# Patient Record
Sex: Female | Born: 1960 | Race: Black or African American | Hispanic: No | Marital: Single | Smoking: Current every day smoker
Health system: Southern US, Community
[De-identification: ages and names within clinical notes are randomized; demographics above are authoritative.]

## PROBLEM LIST (undated history)

## (undated) DIAGNOSIS — I10 Essential (primary) hypertension: Secondary | ICD-10-CM

---

## 2011-09-09 ENCOUNTER — Ambulatory Visit: Payer: Self-pay

## 2017-11-30 ENCOUNTER — Emergency Department (HOSPITAL_COMMUNITY): Payer: Self-pay

## 2017-11-30 ENCOUNTER — Inpatient Hospital Stay (HOSPITAL_COMMUNITY)
Admission: EM | Admit: 2017-11-30 | Discharge: 2017-12-02 | DRG: 069 | Disposition: A | Discharge status: Home / Self Care | Payer: Self-pay | Attending: Family Medicine | Admitting: Family Medicine

## 2017-11-30 ENCOUNTER — Inpatient Hospital Stay (HOSPITAL_COMMUNITY): Payer: Self-pay

## 2017-11-30 ENCOUNTER — Encounter: Payer: Self-pay | Admitting: Pediatric Intensive Care

## 2017-11-30 ENCOUNTER — Encounter (HOSPITAL_COMMUNITY): Payer: Self-pay | Admitting: *Deleted

## 2017-11-30 DIAGNOSIS — I10 Essential (primary) hypertension: Secondary | ICD-10-CM | POA: Diagnosis present

## 2017-11-30 DIAGNOSIS — Z590 Homelessness: Secondary | ICD-10-CM

## 2017-11-30 DIAGNOSIS — G459 Transient cerebral ischemic attack, unspecified: Principal | ICD-10-CM | POA: Diagnosis present

## 2017-11-30 DIAGNOSIS — Z720 Tobacco use: Secondary | ICD-10-CM

## 2017-11-30 DIAGNOSIS — R0602 Shortness of breath: Secondary | ICD-10-CM

## 2017-11-30 DIAGNOSIS — E785 Hyperlipidemia, unspecified: Secondary | ICD-10-CM | POA: Diagnosis present

## 2017-11-30 DIAGNOSIS — Z79899 Other long term (current) drug therapy: Secondary | ICD-10-CM

## 2017-11-30 DIAGNOSIS — F17210 Nicotine dependence, cigarettes, uncomplicated: Secondary | ICD-10-CM | POA: Diagnosis present

## 2017-11-30 DIAGNOSIS — J90 Pleural effusion, not elsewhere classified: Secondary | ICD-10-CM | POA: Diagnosis present

## 2017-11-30 DIAGNOSIS — R4781 Slurred speech: Secondary | ICD-10-CM | POA: Diagnosis present

## 2017-11-30 DIAGNOSIS — Z7982 Long term (current) use of aspirin: Secondary | ICD-10-CM

## 2017-11-30 DIAGNOSIS — R29810 Facial weakness: Secondary | ICD-10-CM | POA: Diagnosis present

## 2017-11-30 HISTORY — DX: Essential (primary) hypertension: I10

## 2017-11-30 LAB — PROTIME-INR
INR: 1
PROTHROMBIN TIME: 13.1 s (ref 11.4–15.2)

## 2017-11-30 LAB — COMPREHENSIVE METABOLIC PANEL
ALK PHOS: 100 U/L (ref 38–126)
ALT: 14 U/L (ref 0–44)
ANION GAP: 7 (ref 5–15)
AST: 17 U/L (ref 15–41)
Albumin: 3.7 g/dL (ref 3.5–5.0)
BILIRUBIN TOTAL: 0.7 mg/dL (ref 0.3–1.2)
BUN: 14 mg/dL (ref 6–20)
CALCIUM: 9.6 mg/dL (ref 8.9–10.3)
CO2: 27 mmol/L (ref 22–32)
Chloride: 107 mmol/L (ref 98–111)
Creatinine, Ser: 0.63 mg/dL (ref 0.44–1.00)
GFR calc non Af Amer: >60 mL/min (ref >60)
Glucose, Bld: 101 mg/dL — ABNORMAL HIGH (ref 70–99)
Potassium: 3.9 mmol/L (ref 3.5–5.1)
SODIUM: 141 mmol/L (ref 135–145)
TOTAL PROTEIN: 6.5 g/dL (ref 6.5–8.1)

## 2017-11-30 LAB — DIFFERENTIAL
Abs Immature Granulocytes: 0.01 10*3/uL (ref 0.00–0.07)
BASOS ABS: 0.1 10*3/uL (ref 0.0–0.1)
Basophils Relative: 1 %
EOS PCT: 3 %
Eosinophils Absolute: 0.2 10*3/uL (ref 0.0–0.5)
Immature Granulocytes: 0 %
LYMPHS PCT: 53 %
Lymphs Abs: 3.4 10*3/uL (ref 0.7–4.0)
MONO ABS: 0.8 10*3/uL (ref 0.1–1.0)
Monocytes Relative: 12 %
Neutro Abs: 2 10*3/uL (ref 1.7–7.7)
Neutrophils Relative %: 31 %

## 2017-11-30 LAB — I-STAT CHEM 8, ED
BUN: 15 mg/dL (ref 6–20)
Calcium, Ion: 1.27 mmol/L (ref 1.15–1.40)
Chloride: 105 mmol/L (ref 98–111)
Creatinine, Ser: 0.7 mg/dL (ref 0.44–1.00)
GLUCOSE: 96 mg/dL (ref 70–99)
HCT: 40 % (ref 36.0–46.0)
HEMOGLOBIN: 13.6 g/dL (ref 12.0–15.0)
Potassium: 3.9 mmol/L (ref 3.5–5.1)
SODIUM: 141 mmol/L (ref 135–145)
TCO2: 28 mmol/L (ref 22–32)

## 2017-11-30 LAB — CBC
HEMATOCRIT: 40.4 % (ref 36.0–46.0)
Hemoglobin: 12.6 g/dL (ref 12.0–15.0)
MCH: 30 pg (ref 26.0–34.0)
MCHC: 31.2 g/dL (ref 30.0–36.0)
MCV: 96.2 fL (ref 80.0–100.0)
Platelets: 252 10*3/uL (ref 150–400)
RBC: 4.2 MIL/uL (ref 3.87–5.11)
RDW: 15.1 % (ref 11.5–15.5)
WBC: 6.5 10*3/uL (ref 4.0–10.5)
nRBC: 0 % (ref 0.0–0.2)

## 2017-11-30 LAB — I-STAT TROPONIN, ED: TROPONIN I, POC: 0.01 ng/mL (ref 0.00–0.08)

## 2017-11-30 LAB — APTT: APTT: 30 s (ref 24–36)

## 2017-11-30 MED ORDER — ACETAMINOPHEN 650 MG RE SUPP: 650.0000 mg | RECTAL | Status: DC | PRN

## 2017-11-30 MED ORDER — SENNOSIDES-DOCUSATE SODIUM 8.6-50 MG PO TABS: 1.0000 | ORAL_TABLET | Freq: Every evening | ORAL | Status: DC | PRN

## 2017-11-30 MED ORDER — ACETAMINOPHEN 325 MG PO TABS: 650.0000 mg | ORAL_TABLET | ORAL | Status: DC | PRN

## 2017-11-30 MED ORDER — ENOXAPARIN SODIUM 40 MG/0.4ML ~~LOC~~ SOLN
40.0000 mg | Freq: Every day | SUBCUTANEOUS | Status: DC
  Administered 2017-12-01 – 2017-12-02 (×2): 40 mg via SUBCUTANEOUS
  Filled 2017-11-30 (×2): qty 0.4

## 2017-11-30 MED ORDER — LISINOPRIL 10 MG PO TABS
10.0000 mg | ORAL_TABLET | Freq: Every day | ORAL | Status: DC
  Administered 2017-11-30 – 2017-12-01 (×2): 10 mg via ORAL
  Filled 2017-11-30 (×2): qty 1

## 2017-11-30 MED ORDER — STROKE: EARLY STAGES OF RECOVERY BOOK
Freq: Once | Status: AC
  Administered 2017-11-30: 23:00:00

## 2017-11-30 MED ORDER — AMLODIPINE BESYLATE 5 MG PO TABS
5.0000 mg | ORAL_TABLET | Freq: Every day | ORAL | Status: DC
  Administered 2017-11-30 – 2017-12-01 (×2): 5 mg via ORAL
  Filled 2017-11-30 (×2): qty 1

## 2017-11-30 MED ORDER — ASPIRIN EC 81 MG PO TBEC
81.0000 mg | DELAYED_RELEASE_TABLET | Freq: Every day | ORAL | Status: DC
  Administered 2017-11-30 – 2017-12-01 (×2): 81 mg via ORAL
  Filled 2017-11-30 (×2): qty 1

## 2017-11-30 MED ORDER — ACETAMINOPHEN 160 MG/5ML PO SOLN: 650.0000 mg | ORAL | Status: DC | PRN

## 2017-11-30 NOTE — ED Notes (Signed)
Attempted report however was advised to called back in 10 minutes.
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
## 2017-11-30 NOTE — ED Provider Notes (Signed)
[facility] EMERGENCY DEPARTMENT Provider Note   CSN: [id number] Arrival date & time: 11/30/17  1146     History   Chief Complaint Chief Complaint  Patient presents with  . Weakness    HPI Sherri Valencia is a 57 y.o. female.  Patient with history of hypertension, question remote history of high cholesterol --presents the emergency department today with complaint of approximately a week of intermittent facial droop, slurred speech, and numbness in her left arm and leg.  Symptoms began acutely and then resolve after short time.  Last episode of this was this morning.  Patient's husband brought her to a nurse today where she was found to have elevated blood pressure.  She is currently taking lisinopril and amlodipine and has 2 bottles of each and states that she has been taking 1 pill from each bottle every day.  It sounds like she may be doubling her dose by accident.  She is also on 81 mg of aspirin but states that she has been out for the past several days.  She states that her symptoms are currently resolved and husband agrees.  She denies any weakness in her arms or legs, difficulty with walking or with balance.  She denies any recent headaches.  She currently resides in a shelter. No h/o afib reported. Aggravating factors: none. Alleviating factors: none.       Past Medical History:  Diagnosis Date  . Hypertension     There are no active problems to display for this patient.   History reviewed. No pertinent surgical history.   OB History   None      Home Medications    Prior to Admission medications   Not on File    Family History History reviewed. No pertinent family history.  Social History Social History   Tobacco Use  . Smoking status: Current Every Day Smoker  . Smokeless tobacco: Never Used  Substance Use Topics  . Alcohol use: Not on file  . Drug use: Not on file     Allergies   Patient has no known allergies.   Review  of Systems Review of Systems  Constitutional: Negative for fever.  HENT: Negative for rhinorrhea and sore throat.   Eyes: Negative for redness.  Respiratory: Negative for cough.   Cardiovascular: Negative for chest pain.  Gastrointestinal: Negative for abdominal pain, diarrhea, nausea and vomiting.  Genitourinary: Negative for dysuria.  Musculoskeletal: Negative for myalgias.  Skin: Negative for rash.  Neurological: Positive for facial asymmetry, weakness (facial, left) and numbness. Negative for tremors, speech difficulty and headaches.     Physical Exam Updated Vital Signs BP (!) 167/88   Pulse 60   Temp 98 F (36.7 C) (Oral)   Resp (!) 24   SpO2 100%   Physical Exam  Constitutional: She is oriented to person, place, and time. She appears well-developed and well-nourished.  HENT:  Head: Normocephalic and atraumatic.  Right Ear: Tympanic membrane, external ear and ear canal normal.  Left Ear: Tympanic membrane, external ear and ear canal normal.  Nose: Nose normal.  Mouth/Throat: Uvula is midline, oropharynx is clear and moist and mucous membranes are normal.  Eyes: Pupils are equal, round, and reactive to light. Conjunctivae, EOM and lids are normal. Right eye exhibits no nystagmus. Left eye exhibits no nystagmus.  Neck: Normal range of motion. Neck supple.  No carotid bruit heard.   Cardiovascular: Normal rate and regular rhythm.  Pulmonary/Chest: Effort normal and breath sounds normal.  Abdominal: Soft. There is no tenderness.  Musculoskeletal: She exhibits no edema or tenderness.       Cervical back: She exhibits normal range of motion, no tenderness and no bony tenderness.  Neurological: She is alert and oriented to person, place, and time. She has normal strength and normal reflexes. No cranial nerve deficit or sensory deficit. She displays a negative Romberg sign. Coordination and gait normal. GCS eye subscore is 4. GCS verbal subscore is 5. GCS motor subscore is 6.    Skin: Skin is warm and dry.  Psychiatric: She has a normal mood and affect.  Nursing note and vitals reviewed.    ED Treatments / Results  Labs (all labs ordered are listed, but only abnormal results are displayed) Labs Reviewed  COMPREHENSIVE METABOLIC PANEL - Abnormal; Notable for the following components:      Result Value   Glucose, Bld 101 (*)    All other components within normal limits  PROTIME-INR  APTT  CBC  DIFFERENTIAL  I-STAT TROPONIN, ED  CBG MONITORING, ED  I-STAT CHEM 8, ED    ED ECG REPORT   Date: 11/30/2017  Rate: 81  Rhythm: normal sinus rhythm  QRS Axis: normal  Intervals: normal  ST/T Wave abnormalities: nonspecific T wave changes  Conduction Disutrbances:none  Narrative Interpretation: LVH, ?u-waves  Old EKG Reviewed: none available  I have personally reviewed the EKG tracing and agree with the computerized printout as noted.  Radiology Ct Head Wo Contrast  Result Date: 11/30/2017 CLINICAL DATA:  Left-sided facial droop both upper and lower extremity left-sided weakness. Hypertension. EXAM: CT HEAD WITHOUT CONTRAST TECHNIQUE: Contiguous axial images were obtained from the base of the skull through the vertex without intravenous contrast. COMPARISON:  None. FINDINGS: Brain: The ventricles are normal in size and configuration. There is no demonstrable intracranial mass, hemorrhage, extra-axial fluid collection, or midline shift. There is slight small vessel disease in the centra semiovale bilaterally. No acute infarct is evident. Vascular: There is no evident hyperdense vessel. There is mild calcification in each cavernous carotid artery region. Skull: The bony calvarium appears intact. Sinuses/Orbits: Visualized paranasal sinuses are clear. Orbits appear symmetric bilaterally. Other: Mastoid air cells are clear. IMPRESSION: Slight periventricular small vessel disease. No acute infarct evident. No mass or hemorrhage. There are foci of vascular  calcification in each cavernous carotid artery. Electronically Signed   By: Bretta Bang III M.D.   On: 11/30/2017 14:40    Procedures Procedures (including critical care time)  Medications Ordered in ED Medications - No data to display   Initial Impression / Assessment and Plan / ED Course  I have reviewed the triage vital signs and the nursing notes.  Pertinent labs & imaging results that were available during my care of the patient were reviewed by me and considered in my medical decision making (see chart for details).     Patient seen and examined. Work-up reviewed.   Vital signs reviewed and are as follows: BP (!) 175/93   Pulse 63   Temp 98 F (36.7 C) (Oral)   Resp (!) 21   SpO2 100%   Concern for TIA. No code stroke due to resolved symptoms. Patient does not have good PCP f/u currently and would benefit from admission.   Discussed with Dr. Effie Shy. Will request admit.   Spoke with [facility]. They will see patient.   Final Clinical Impressions(s) / ED Diagnoses   Final diagnoses:  Essential hypertension  Facial droop  TIA (transient ischemic attack)  Admit/obs for TIA sx in setting of poorly controlled hypertension.  ED Discharge Orders    None       Renne Crigler, Cordelia Poche 11/30/17 1636    Mancel Bale, MD 12/02/17 1012    Mancel Bale, MD 12/02/17 1013

## 2017-11-30 NOTE — Congregational Nurse Program (Signed)
New client encounter- client is here with husband, states they recently arrived from [facility]. Client states history of hypertension. States she was seen in clinic at shelter in [place] and prescribed amlodipine 5mg  + lisinopril 10mg  daily. Client states she takes these medications at night due to causing "dizziness". Client is concerned about LUE weakness which she states is new onset- last week- along with facial droop. Client partner states he noticed face droop (right side) about 3 days ago and client began slurring her speech. Client does not have PCP in area. CN provided bus passes and advised client to seek further evaluation in [facility] ED. Client agreed to plan and to seek further evaluation.

## 2017-11-30 NOTE — ED Triage Notes (Signed)
Pt in c/o hypertension this morning, in the last week she had left facial droop and left arm weakness and leg numbness at times, went [facility] and was told her BP was high, no distress noted

## 2017-11-30 NOTE — ED Notes (Signed)
Pt returned from MRI °

## 2017-11-30 NOTE — Progress Notes (Signed)
Pt admitted from ED with stroke diagnosis, pt alert and oriented, c/o of slight lower abdominal pain, settled in bed with husband and call light at bedside, tele monitor put and verified on pt, safety concern addressed accordingly, was however reassured and will continue to monitor, v/s stable. Obasogie-Asidi, Chelle Cayton Efe
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
## 2017-11-30 NOTE — ED Notes (Signed)
Patient transported to MRI 

## 2017-12-01 ENCOUNTER — Other Ambulatory Visit: Payer: Self-pay

## 2017-12-01 ENCOUNTER — Inpatient Hospital Stay (HOSPITAL_COMMUNITY): Payer: Self-pay

## 2017-12-01 ENCOUNTER — Ambulatory Visit (HOSPITAL_COMMUNITY): Payer: Self-pay

## 2017-12-01 DIAGNOSIS — I10 Essential (primary) hypertension: Secondary | ICD-10-CM

## 2017-12-01 DIAGNOSIS — G459 Transient cerebral ischemic attack, unspecified: Principal | ICD-10-CM

## 2017-12-01 DIAGNOSIS — I5030 Unspecified diastolic (congestive) heart failure: Secondary | ICD-10-CM

## 2017-12-01 DIAGNOSIS — R0602 Shortness of breath: Secondary | ICD-10-CM

## 2017-12-01 DIAGNOSIS — Z720 Tobacco use: Secondary | ICD-10-CM

## 2017-12-01 LAB — BASIC METABOLIC PANEL
Anion gap: 7 (ref 5–15)
BUN: 10 mg/dL (ref 6–20)
CO2: 24 mmol/L (ref 22–32)
CREATININE: 0.61 mg/dL (ref 0.44–1.00)
Calcium: 9.2 mg/dL (ref 8.9–10.3)
Chloride: 108 mmol/L (ref 98–111)
GFR calc Af Amer: >60 mL/min (ref >60)
GFR calc non Af Amer: >60 mL/min (ref >60)
Glucose, Bld: 102 mg/dL — ABNORMAL HIGH (ref 70–99)
Potassium: 3.5 mmol/L (ref 3.5–5.1)
SODIUM: 139 mmol/L (ref 135–145)

## 2017-12-01 LAB — RAPID URINE DRUG SCREEN, HOSP PERFORMED
AMPHETAMINES: NOT DETECTED
BENZODIAZEPINES: NOT DETECTED
Barbiturates: NOT DETECTED
Cocaine: NOT DETECTED
OPIATES: NOT DETECTED
TETRAHYDROCANNABINOL: NOT DETECTED

## 2017-12-01 LAB — HEMOGLOBIN A1C
Hgb A1c MFr Bld: 4.7 % — ABNORMAL LOW (ref 4.8–5.6)
Mean Plasma Glucose: 88.19 mg/dL

## 2017-12-01 LAB — ECHOCARDIOGRAM COMPLETE
Height: 66 in
Weight: 2761.92 oz

## 2017-12-01 LAB — CBC
HCT: 38 % (ref 36.0–46.0)
Hemoglobin: 12.3 g/dL (ref 12.0–15.0)
MCH: 30.4 pg (ref 26.0–34.0)
MCHC: 32.4 g/dL (ref 30.0–36.0)
MCV: 93.8 fL (ref 80.0–100.0)
Platelets: 227 10*3/uL (ref 150–400)
RBC: 4.05 MIL/uL (ref 3.87–5.11)
RDW: 14.9 % (ref 11.5–15.5)
WBC: 5.9 10*3/uL (ref 4.0–10.5)
nRBC: 0 % (ref 0.0–0.2)

## 2017-12-01 LAB — TSH: TSH: 2.754 u[IU]/mL (ref 0.350–4.500)

## 2017-12-01 LAB — HIV ANTIBODY (ROUTINE TESTING W REFLEX): HIV SCREEN 4TH GENERATION: NONREACTIVE

## 2017-12-01 LAB — LIPID PANEL
CHOLESTEROL: 185 mg/dL (ref 0–200)
HDL: 71 mg/dL (ref >40)
LDL Cholesterol: 99 mg/dL (ref 0–99)
TRIGLYCERIDES: 74 mg/dL (ref <150)
Total CHOL/HDL Ratio: 2.6 RATIO
VLDL: 15 mg/dL (ref 0–40)

## 2017-12-01 MED ORDER — NICOTINE 7 MG/24HR TD PT24: 7.0000 mg | MEDICATED_PATCH | Freq: Every day | TRANSDERMAL | Status: DC

## 2017-12-01 MED ORDER — NICOTINE 21 MG/24HR TD PT24
21.0000 mg | MEDICATED_PATCH | Freq: Every day | TRANSDERMAL | Status: DC
  Administered 2017-12-01 – 2017-12-02 (×2): 21 mg via TRANSDERMAL
  Filled 2017-12-01 (×2): qty 1

## 2017-12-01 MED ORDER — HYDROCHLOROTHIAZIDE 12.5 MG PO CAPS
12.5000 mg | ORAL_CAPSULE | Freq: Every day | ORAL | Status: DC
Start: 2017-12-01 — End: 2017-12-02
  Administered 2017-12-01 – 2017-12-02 (×2): 12.5 mg via ORAL
  Filled 2017-12-01 (×2): qty 1

## 2017-12-01 MED ORDER — CLOPIDOGREL BISULFATE 75 MG PO TABS
75.0000 mg | ORAL_TABLET | Freq: Every day | ORAL | Status: DC
  Administered 2017-12-01 – 2017-12-02 (×2): 75 mg via ORAL
  Filled 2017-12-01 (×2): qty 1

## 2017-12-01 MED ORDER — ATORVASTATIN CALCIUM 40 MG PO TABS
40.0000 mg | ORAL_TABLET | Freq: Every day | ORAL | Status: DC
  Administered 2017-12-01: 40 mg via ORAL
  Filled 2017-12-01: qty 1
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
## 2017-12-01 NOTE — Progress Notes (Signed)
  Echocardiogram 2D Echocardiogram has been performed.  Pieter Partridge 12/01/2017, 12:12 PM

## 2017-12-01 NOTE — Progress Notes (Signed)
*  Preliminary Results* Carotid artery duplex has been completed. Bilateral internal carotid arteries are near-normal with 1-39%. Vertebral arteries are patent with antegrade flow.  12/01/2017 12:03 PM  Sherri Valencia Clare Gandy

## 2017-12-01 NOTE — Evaluation (Signed)
Speech Language Pathology Evaluation Patient Details Name: Sherri Valencia MRN: [id number] DOB: 04-09-1960 Today's Date: 12/01/2017 Time: 1040-1055 SLP Time Calculation (min) (ACUTE ONLY): 15 min  Problem List:  Patient Active Problem List   Diagnosis Date Noted  . TIA (transient ischemic attack) 11/30/2017   Past Medical History:  Past Medical History:  Diagnosis Date  . Hypertension    Past Surgical History: History reviewed. No pertinent surgical history. HPI:  Pt is a 57 y.o. female admitted to hospital after 1 week history of intermittent left upper extremity, left lower extremity weakness and numbness. CT revealed subacute small left internal capsule infarct and mild-to-moderate chronic small vessel ischemic changes. PMH signficant for HTN.   Assessment / Plan / Recommendation Clinical Impression   Pt presents with a mild dysarthria resulting from left sided oral motor weakness which impacts articulatory precision at the conversational level.  Pt was fully intelligible in conversations.  Pt reported mild focal right sided "tightness" (CN V) but sensory exam was otherwise unremarkable.  Pt appears to have grossly [facility] cognitive-linguistic function for all tasks assessed and pt appears appropriate to return to PLOF.  Pt denies any changes in cognition or language and reports that her speech is improving daily.   SLP did provide skilled education regarding compensatory strategies to achieve articulatory precision, emphasizing overarticulation, slow rate, and loud voice. All questions were answered to pt's satisfaction at this time.   No further ST needs are indicated at this time.      SLP Assessment  SLP Recommendation/Assessment: Patient does not need any further Speech Lanaguage Pathology Services SLP Visit Diagnosis: Dysarthria and anarthria (R47.1)    Follow Up Recommendations  None          SLP Evaluation Cognition  Overall Cognitive Status: Within Functional Limits for  tasks assessed Attention: Sustained Sustained Attention: Appears intact Memory: Appears intact(3/4 delayed recall) Awareness: Appears intact Safety/Judgment: Appears intact       Comprehension  Auditory Comprehension Overall Auditory Comprehension: Appears within functional limits for tasks assessed    Expression Expression Primary Mode of Expression: Verbal Verbal Expression Overall Verbal Expression: Appears within functional limits for tasks assessed Written Expression Dominant Hand: Right   Oral / Motor  Oral Motor/Sensory Function Overall Oral Motor/Sensory Function: Mild impairment Facial ROM: Reduced left Facial Symmetry: Abnormal symmetry left Facial Sensation: (pt reports "tightness" on right side, otherwise unremarkable) Lingual ROM: Within Functional Limits Lingual Symmetry: Abnormal symmetry left Lingual Strength: Within Functional Limits Mandible: Within Functional Limits Motor Speech Overall Motor Speech: Appears within functional limits for tasks assessed Respiration: Within functional limits Phonation: Normal Resonance: Within functional limits Articulation: Impaired Level of Impairment: Conversation Intelligibility: Intelligible Motor Planning: Witnin functional limits   GO                    Lynden Carrithers, Melanee Spry 12/01/2017, 11:04 AM
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
## 2017-12-02 ENCOUNTER — Telehealth: Payer: Self-pay

## 2017-12-02 LAB — CBC
HEMATOCRIT: 39.2 % (ref 36.0–46.0)
HEMOGLOBIN: 12.4 g/dL (ref 12.0–15.0)
MCH: 30.1 pg (ref 26.0–34.0)
MCHC: 31.6 g/dL (ref 30.0–36.0)
MCV: 95.1 fL (ref 80.0–100.0)
NRBC: 0 % (ref 0.0–0.2)
Platelets: 248 10*3/uL (ref 150–400)
RBC: 4.12 MIL/uL (ref 3.87–5.11)
RDW: 14.7 % (ref 11.5–15.5)
WBC: 4.9 10*3/uL (ref 4.0–10.5)

## 2017-12-02 LAB — BASIC METABOLIC PANEL
Anion gap: 4 — ABNORMAL LOW (ref 5–15)
BUN: 12 mg/dL (ref 6–20)
CO2: 29 mmol/L (ref 22–32)
Calcium: 9.5 mg/dL (ref 8.9–10.3)
Chloride: 106 mmol/L (ref 98–111)
Creatinine, Ser: 0.66 mg/dL (ref 0.44–1.00)
GFR calc Af Amer: >60 mL/min (ref >60)
GFR calc non Af Amer: >60 mL/min (ref >60)
Glucose, Bld: 94 mg/dL (ref 70–99)
Potassium: 3.5 mmol/L (ref 3.5–5.1)
Sodium: 139 mmol/L (ref 135–145)

## 2017-12-02 MED ORDER — ATORVASTATIN CALCIUM 40 MG PO TABS: 40.0000 mg | ORAL_TABLET | Freq: Every day | ORAL | 0 refills | Status: AC

## 2017-12-02 MED ORDER — NICOTINE 21 MG/24HR TD PT24: 21.0000 mg | MEDICATED_PATCH | Freq: Every day | TRANSDERMAL | 0 refills | Status: DC

## 2017-12-02 MED ORDER — AMLODIPINE BESYLATE 5 MG PO TABS: 5.0000 mg | ORAL_TABLET | Freq: Every day | ORAL | 0 refills | Status: AC

## 2017-12-02 MED ORDER — ASPIRIN EC 81 MG PO TBEC: 81.0000 mg | DELAYED_RELEASE_TABLET | Freq: Every day | ORAL | 0 refills | Status: DC

## 2017-12-02 MED ORDER — CLOPIDOGREL BISULFATE 75 MG PO TABS: 75.0000 mg | ORAL_TABLET | Freq: Every day | ORAL | 0 refills | Status: AC

## 2017-12-02 MED ORDER — HYDROCHLOROTHIAZIDE 12.5 MG PO CAPS: 12.5000 mg | ORAL_CAPSULE | Freq: Every day | ORAL | 0 refills | Status: AC

## 2017-12-02 MED ORDER — LISINOPRIL 10 MG PO TABS: 10.0000 mg | ORAL_TABLET | Freq: Every day | ORAL | 0 refills | Status: AC

## 2017-12-02 MED ORDER — SENNOSIDES-DOCUSATE SODIUM 8.6-50 MG PO TABS: 1.0000 | ORAL_TABLET | Freq: Every evening | ORAL | 0 refills | Status: DC | PRN

## 2017-12-02 MED FILL — CLOPIDOGREL 75 MG TABLET: 75 | 30 days supply | Qty: 30 | Fill #0 | Status: TO

## 2017-12-02 MED FILL — ATORVASTATIN CALCIUM 40 MG: 40 | 30 days supply | Qty: 30 | Fill #0 | Status: TO

## 2017-12-02 MED FILL — LISINOPRIL 10 MG TABS: 10 | 30 days supply | Qty: 30 | Fill #0 | Status: TO

## 2017-12-02 MED FILL — ASPIRIN LOW DOSE 81 MG TBEC: 81 | 21 days supply | Qty: 21 | Fill #0

## 2017-12-02 MED FILL — SENNA S 8.6-50 MG TABS: 8.6-50 | 30 days supply | Qty: 30 | Fill #0

## 2017-12-02 MED FILL — HYDROCHLOROTHIAZIDE 12.5 MG: 12.5 | 30 days supply | Qty: 30 | Fill #0 | Status: TO

## 2017-12-02 MED FILL — AMLODIPINE BESYLATE 5 MG TA: 5 | 30 days supply | Qty: 30 | Fill #0 | Status: TO
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
## 2017-12-02 NOTE — Telephone Encounter (Signed)
Message received from Fabio Neighbors, RN CM requesting a hospital follow up appointment for the patient  An appointment was scheduled for 12/11/17 @ 1430 and the information was placed on the AVS. 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

## 2017-12-02 NOTE — Discharge Instructions (Signed)
Call 1800-QUIT-NOW for help with stopping smoking. They can assist with free resources such as patches, check-in calls, and counseling.   You will continue to use your current nicotine patch for 6 weeks.  Then he will need to be seen by her doctor to get a decreased dose.  Please make sure that you do not smoke with a nicotine patch on.  He will continue to take aspirin and Plavix together for 3 weeks.  At the end of that 3 weeks you should stop taking aspirin, but continue to take Plavix daily.  Please make sure that you follow-up with your new doctor on 11/8, as scheduled.  I have given you enough medications for 90 days.

## 2017-12-02 NOTE — Progress Notes (Signed)
Occupational Therapy Treatment Patient Details Name: Sherri Valencia MRN: [id number] DOB: 04-26-1960 Today's Date: 12/02/2017    History of present illness PT is a 57 y.o. female admitted to hospital after 1 week history of intermittent left upper extremity, left lower extremity weakness and numbness. CT revealed subacute small left internal capsule infarct and mild-to-moderate chronic small vessel ischemic changes. PMH signficant for HTN.   OT comments  Patient progressing well.  Demonstrates safe shower transfer and bathing techniques standing using grabbars. Reviewed med mgmt safety and use of pill box, medi-cog completed scoring 8/10 with errors with "multiple pills per day" but verbalized understanding after education.  Visual scanning improving, possible visual attention deficit and reviewed use of cross word puzzles at home.  Pt eager and thankful for recommendations.    Follow Up Recommendations  No OT follow up    Equipment Recommendations  None recommended by OT    Recommendations for Other Services      Precautions / Restrictions Precautions Precautions: Fall       Mobility Bed Mobility Overal bed mobility: Independent                Transfers Overall transfer level: Modified independent Equipment used: None Transfers: Sit to/from Stand Sit to Stand: Modified independent (Device/Increase time)         General transfer comment: no assist required    Balance Overall balance assessment: Independent                                         ADL either performed or assessed with clinical judgement   ADL Overall ADL's : Needs assistance/impaired             Lower Body Bathing: Modified independent Lower Body Bathing Details (indicate cue type and reason): demonstrated use of grabbars standing          Toilet Transfer: Modified Independent;Ambulation       Tub/ Shower Transfer: Modified independent;Ambulation   Functional  mobility during ADLs: Modified independent       Vision   Vision Assessment?: Yes Tracking/Visual Pursuits: Able to track stimulus in all quads without difficulty Additional Comments: improved visual scanning and holding outside midline, visual attention? reviewed visual attention scanning activitis pt can engage in at home (cross word puzzles)    Perception     Praxis      Cognition Arousal/Alertness: Awake/alert Behavior During Therapy: WFL for tasks assessed/performed Overall Cognitive Status: Impaired/Different from baseline Area of Impairment: Problem solving                             Problem Solving: Difficulty sequencing General Comments: Completed medi-cog with patient scoring 8/10, difficulty with problem sovling through multiple pills/day.         Exercises     Shoulder Instructions       General Comments spouse present    Pertinent Vitals/ Pain       Pain Assessment: No/denies pain  Home Living                                          Prior Functioning/Environment              Frequency  Min 2X/week  Progress Toward Goals  OT Goals(current goals can now be found in the care plan section)  Progress towards OT goals: Progressing toward goals  Acute Rehab OT Goals Patient Stated Goal: to get back home OT Goal Formulation: With patient Time For Goal Achievement: 12/08/17 Potential to Achieve Goals: Good  Plan Discharge plan remains appropriate;Frequency remains appropriate    Co-evaluation                 AM-PAC PT "6 Clicks" Daily Activity     Outcome Measure   Help from another person eating meals?: None Help from another person taking care of personal grooming?: None Help from another person toileting, which includes using toliet, bedpan, or urinal?: None Help from another person bathing (including washing, rinsing, drying)?: None Help from another person to put on and taking off regular  upper body clothing?: None Help from another person to put on and taking off regular lower body clothing?: None 6 Click Score: 24    End of Session    OT Visit Diagnosis: Other symptoms and signs involving the nervous system (R29.898);Other symptoms and signs involving cognitive function   Activity Tolerance Patient tolerated treatment well   Patient Left with family/visitor present;with call bell/phone within reach;in bed   Nurse Communication Mobility status        Time: 1610-9604 OT Time Calculation (min): 16 min  Charges: OT General Charges $OT Visit: 1 Visit OT Treatments $Self Care/Home Management : 8-22 mins  Chancy Milroy, OT Acute Rehabilitation Services Pager [phone] Office [phone]    Chancy Milroy 12/02/2017, 1:20 PM

## 2017-12-02 NOTE — Progress Notes (Signed)
Pt being discharged from hospital per orders from MD. Pt educated on discharge instructions. Pt verbalized understanding of instructions. All questions and concerns were addressed. Pt's IV was removed prior to discharge. Pt exited hospital via ambulation accompanied by staff. 
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
## 2017-12-04 ENCOUNTER — Encounter: Payer: Self-pay | Admitting: Pediatric Intensive Care

## 2017-12-11 ENCOUNTER — Ambulatory Visit: Payer: Self-pay | Attending: Family Medicine | Admitting: Family Medicine

## 2017-12-11 VITALS — BP 144/74 | HR 71 | Temp 97.7°F

## 2017-12-11 DIAGNOSIS — Z1239 Encounter for other screening for malignant neoplasm of breast: Secondary | ICD-10-CM

## 2017-12-11 DIAGNOSIS — Z79899 Other long term (current) drug therapy: Secondary | ICD-10-CM

## 2017-12-11 DIAGNOSIS — Z8673 Personal history of transient ischemic attack (TIA), and cerebral infarction without residual deficits: Secondary | ICD-10-CM

## 2017-12-11 DIAGNOSIS — F17210 Nicotine dependence, cigarettes, uncomplicated: Secondary | ICD-10-CM | POA: Insufficient documentation

## 2017-12-11 DIAGNOSIS — J90 Pleural effusion, not elsewhere classified: Secondary | ICD-10-CM

## 2017-12-11 DIAGNOSIS — I10 Essential (primary) hypertension: Secondary | ICD-10-CM

## 2017-12-11 DIAGNOSIS — Z720 Tobacco use: Secondary | ICD-10-CM

## 2017-12-11 DIAGNOSIS — R0602 Shortness of breath: Secondary | ICD-10-CM

## 2017-12-11 MED ORDER — NICOTINE 14 MG/24HR TD PT24: 14.0000 mg | MEDICATED_PATCH | Freq: Every day | TRANSDERMAL | 0 refills | Status: DC

## 2017-12-11 MED ORDER — NICOTINE 7 MG/24HR TD PT24: 7.0000 mg | MEDICATED_PATCH | Freq: Every day | TRANSDERMAL | 0 refills | Status: DC
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
## 2017-12-11 NOTE — Progress Notes (Signed)
Patient has been feeling weak and having SOB.  Patient needs a letter stating that she can go into shelter during the day to sit down and rest.

## 2017-12-12 LAB — COMPREHENSIVE METABOLIC PANEL WITH GFR
ALT: 12 IU/L (ref 0–32)
AST: 14 IU/L (ref 0–40)
Albumin/Globulin Ratio: 2 (ref 1.2–2.2)
Albumin: 4.7 g/dL (ref 3.5–5.5)
Alkaline Phosphatase: 121 IU/L — ABNORMAL HIGH (ref 39–117)
BUN/Creatinine Ratio: 25 — ABNORMAL HIGH (ref 9–23)
BUN: 16 mg/dL (ref 6–24)
Bilirubin Total: 0.4 mg/dL (ref 0.0–1.2)
CO2: 27 mmol/L (ref 20–29)
Calcium: 10.3 mg/dL — ABNORMAL HIGH (ref 8.7–10.2)
Chloride: 99 mmol/L (ref 96–106)
Creatinine, Ser: 0.63 mg/dL (ref 0.57–1.00)
GFR calc Af Amer: 114 mL/min/1.73
GFR calc non Af Amer: 98 mL/min/1.73
Globulin, Total: 2.3 g/dL (ref 1.5–4.5)
Glucose: 83 mg/dL (ref 65–99)
Potassium: 3.9 mmol/L (ref 3.5–5.2)
Sodium: 141 mmol/L (ref 134–144)
Total Protein: 7 g/dL (ref 6.0–8.5)

## 2017-12-12 LAB — CBC WITH DIFFERENTIAL/PLATELET
Basophils Absolute: 0.1 x10E3/uL (ref 0.0–0.2)
Basos: 1 %
EOS (ABSOLUTE): 0.3 x10E3/uL (ref 0.0–0.4)
Eos: 4 %
Hematocrit: 40.2 % (ref 34.0–46.6)
Hemoglobin: 13.4 g/dL (ref 11.1–15.9)
Immature Grans (Abs): 0 x10E3/uL (ref 0.0–0.1)
Immature Granulocytes: 0 %
Lymphocytes Absolute: 4.1 x10E3/uL — ABNORMAL HIGH (ref 0.7–3.1)
Lymphs: 56 %
MCH: 30.9 pg (ref 26.6–33.0)
MCHC: 33.3 g/dL (ref 31.5–35.7)
MCV: 93 fL (ref 79–97)
Monocytes Absolute: 0.8 x10E3/uL (ref 0.1–0.9)
Monocytes: 10 %
Neutrophils Absolute: 2.2 x10E3/uL (ref 1.4–7.0)
Neutrophils: 29 %
Platelets: 314 x10E3/uL (ref 150–450)
RBC: 4.34 x10E6/uL (ref 3.77–5.28)
RDW: 13 % (ref 12.3–15.4)
WBC: 7.5 x10E3/uL (ref 3.4–10.8)

## 2017-12-15 ENCOUNTER — Encounter: Payer: Self-pay | Admitting: Pediatric Intensive Care

## 2017-12-18 ENCOUNTER — Telehealth: Payer: Self-pay | Admitting: *Deleted

## 2017-12-18 ENCOUNTER — Other Ambulatory Visit (HOSPITAL_COMMUNITY): Payer: Self-pay | Admitting: *Deleted

## 2017-12-18 DIAGNOSIS — N644 Mastodynia: Secondary | ICD-10-CM

## 2017-12-18 NOTE — Telephone Encounter (Signed)
Notes recorded by Guy FrancoBenjamin, Eisen Robenson, RN on 12/18/2017 at 3:41 PM EST Left message on voicemail to return call.   ------  Notes recorded by Cain SaupeFulp, Cammie, MD on 12/15/2017 at 10:31 PM EST Please notify patient that her complete metabolic panel is essentially normal but she does have a very small increase in a nonspecific enzyme called alkaline phosphatase at 121 with normal being 39-117 This enzyme is found not only in the liver but also in the bone and salivary glands. Will recheck at a future date as this is still very close to normal. Patient with a normal complete blood count

## 2017-12-22 ENCOUNTER — Telehealth: Payer: Self-pay

## 2017-12-22 ENCOUNTER — Ambulatory Visit (HOSPITAL_COMMUNITY)
Admission: RE | Admit: 2017-12-22 | Discharge: 2017-12-22 | Disposition: A | Discharge status: Home / Self Care | Payer: Self-pay | Source: Ambulatory Visit | Attending: Family Medicine | Admitting: Family Medicine

## 2017-12-22 ENCOUNTER — Encounter: Payer: Self-pay | Admitting: Pediatric Intensive Care

## 2017-12-22 DIAGNOSIS — K769 Liver disease, unspecified: Secondary | ICD-10-CM | POA: Insufficient documentation

## 2017-12-22 DIAGNOSIS — I700 Atherosclerosis of aorta: Secondary | ICD-10-CM | POA: Insufficient documentation

## 2017-12-22 DIAGNOSIS — J90 Pleural effusion, not elsewhere classified: Secondary | ICD-10-CM | POA: Insufficient documentation

## 2017-12-22 MED ORDER — IOHEXOL 300 MG/ML  SOLN
75.0000 mL | Freq: Once | INTRAMUSCULAR | Status: AC | PRN
  Administered 2017-12-22: 75 mL via INTRAVENOUS

## 2017-12-22 NOTE — Telephone Encounter (Signed)
Request received from Shann MedalVictoria Hussey, RN/[facility] for a follow up appointment for the patient [facility]  An appointment was scheduled for 01/04/18 @ 1330

## 2017-12-24 NOTE — Telephone Encounter (Signed)
Left message on voicemail to return call. Unable to reach.  

## 2017-12-24 NOTE — Congregational Nurse Program (Signed)
Hospital follow up with BP check. Client given 90 days of medication from hospital discharge. CN assisted with organizing medication in pill box. Lower extremity edema is improved now +1 feet and ankles. SCAT application given. Follow up in clinic as needed.

## 2018-01-04 ENCOUNTER — Ambulatory Visit: Payer: Self-pay | Attending: Critical Care Medicine | Admitting: Critical Care Medicine

## 2018-01-04 ENCOUNTER — Encounter: Payer: Self-pay | Admitting: Pediatric Intensive Care

## 2018-01-04 ENCOUNTER — Encounter: Payer: Self-pay | Admitting: Critical Care Medicine

## 2018-01-04 ENCOUNTER — Other Ambulatory Visit: Payer: Self-pay

## 2018-01-04 VITALS — BP 139/85 | HR 81 | Temp 98.4°F | Ht 64.0 in | Wt 178.4 lb

## 2018-01-04 DIAGNOSIS — H60392 Other infective otitis externa, left ear: Secondary | ICD-10-CM

## 2018-01-04 DIAGNOSIS — H6090 Unspecified otitis externa, unspecified ear: Secondary | ICD-10-CM | POA: Insufficient documentation

## 2018-01-04 DIAGNOSIS — Z7982 Long term (current) use of aspirin: Secondary | ICD-10-CM | POA: Insufficient documentation

## 2018-01-04 DIAGNOSIS — J0100 Acute maxillary sinusitis, unspecified: Secondary | ICD-10-CM | POA: Insufficient documentation

## 2018-01-04 DIAGNOSIS — H6092 Unspecified otitis externa, left ear: Secondary | ICD-10-CM | POA: Insufficient documentation

## 2018-01-04 DIAGNOSIS — H6122 Impacted cerumen, left ear: Secondary | ICD-10-CM

## 2018-01-04 DIAGNOSIS — R0602 Shortness of breath: Secondary | ICD-10-CM | POA: Insufficient documentation

## 2018-01-04 DIAGNOSIS — Z79899 Other long term (current) drug therapy: Secondary | ICD-10-CM | POA: Insufficient documentation

## 2018-01-04 MED ORDER — ASPIRIN EC 81 MG PO TBEC: 81.0000 mg | DELAYED_RELEASE_TABLET | Freq: Every day | ORAL | 6 refills | Status: DC

## 2018-01-04 MED ORDER — SENNOSIDES-DOCUSATE SODIUM 8.6-50 MG PO TABS: 1.0000 | ORAL_TABLET | Freq: Every evening | ORAL | 6 refills | Status: AC | PRN

## 2018-01-04 MED ORDER — NEOMYCIN-POLYMYXIN-HC 1 % OT SOLN: 3.0000 [drp] | Freq: Four times a day (QID) | OTIC | 0 refills | Status: AC

## 2018-01-04 MED ORDER — DOXYCYCLINE HYCLATE 100 MG PO TABS: 100.0000 mg | ORAL_TABLET | Freq: Two times a day (BID) | ORAL | 0 refills | Status: AC

## 2018-01-04 MED FILL — AMLODIPINE BESYLATE 5 MG TA: 5 | 30 days supply | Qty: 30 | Fill #0

## 2018-01-04 MED FILL — CLOPIDOGREL 75 MG TABLET: 75 | 30 days supply | Qty: 30 | Fill #0

## 2018-01-04 MED FILL — ATORVASTATIN CALCIUM 40 MG: 40 | 30 days supply | Qty: 30 | Fill #0

## 2018-01-04 MED FILL — HYDROCHLOROTHIAZIDE 12.5 MG: 12.5 | 30 days supply | Qty: 30 | Fill #0

## 2018-01-04 MED FILL — LISINOPRIL 10 MG TABS: 10 | 30 days supply | Qty: 30 | Fill #0

## 2018-01-04 NOTE — Assessment & Plan Note (Signed)
Evidence for acute nonrecurrent maxillary sinusitis on exam  Plan  Doxycycline 100 mg twice daily for 5 days

## 2018-01-04 NOTE — Assessment & Plan Note (Addendum)
Shortness of breath is on the basis of an acute URI syndrome Note recent CT scan of the chest did not show pleural effusion or intrapulmonary infiltrates  Review sinusitis assessment
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
## 2018-01-04 NOTE — Progress Notes (Signed)
Subjective:    Patient ID: Sherri Valencia, female    DOB: 10/28/1960, 57 y.o.   MRN: [id number]  57 y.o.F here for URI.  [facility] Followed here for HTN last seen 12/12/17. Pt had FLu shot on 11/18  URI   This is a new problem. The current episode started 1 to 4 weeks ago. The problem has been unchanged. There has been no fever. Associated symptoms include congestion, coughing, a plugged ear sensation, rhinorrhea, sinus pain, sneezing, a sore throat, swollen glands and wheezing. Pertinent negatives include no abdominal pain, chest pain, diarrhea, ear pain, headaches, joint pain, joint swelling, nausea, neck pain, rash or vomiting. Associated symptoms comments: Noting wheezing in chest and coughing all night.  Cough is productive lyme green Green out of the nose. She has tried acetaminophen for the symptoms. The treatment provided no relief.   Past Medical History:  Diagnosis Date  . Hypertension      Family History  Problem Relation Age of Onset  . Alzheimer's disease Father   . Cancer Father   . Heart disease Father      Social History   Socioeconomic History  . Marital status: Single    Spouse name: Not on file  . Number of children: Not on file  . Years of education: Not on file  . Highest education level: Not on file  Occupational History  . Not on file  Social Needs  . Financial resource strain: Not on file  . Food insecurity:    Worry: Not on file    Inability: Not on file  . Transportation needs:    Medical: Not on file    Non-medical: Not on file  Tobacco Use  . Smoking status: Current Every Day Smoker    Types: Cigarettes  . Smokeless tobacco: Never Used  . Tobacco comment: smokes 2 cigs./ day  Substance and Sexual Activity  . Alcohol use: Not Currently  . Drug use: Not Currently  . Sexual activity: Not on file  Lifestyle  . Physical activity:    Days per week: Not on file    Minutes per session: Not on file  . Stress: Not on  file  Relationships  . Social connections:    Talks on phone: Not on file    Gets together: Not on file    Attends religious service: Not on file    Active member of club or organization: Not on file    Attends meetings of clubs or organizations: Not on file    Relationship status: Not on file  . Intimate partner violence:    Fear of current or ex partner: Not on file    Emotionally abused: Not on file    Physically abused: Not on file    Forced sexual activity: Not on file  Other Topics Concern  . Not on file  Social History Narrative  . Not on file     Allergies  Allergen Reactions  . Banana Hives     Outpatient Medications Prior to Visit  Medication Sig Dispense Refill  . amLODipine (NORVASC) 5 MG tablet Take 1 tablet (5 mg total) by mouth at bedtime. 90 tablet 0  . aspirin EC 81 MG tablet Take 1 tablet (81 mg total) by mouth at bedtime. Take for 3 weeks then stop. 21 tablet 0  . atorvastatin (LIPITOR) 40 MG tablet Take 1 tablet (40 mg total) by mouth daily at 6 PM. 90 tablet 0  . clopidogrel (  PLAVIX) 75 MG tablet Take 1 tablet (75 mg total) by mouth daily. 90 tablet 0  . hydrochlorothiazide (MICROZIDE) 12.5 MG capsule Take 1 capsule (12.5 mg total) by mouth daily. 90 capsule 0  . lisinopril (PRINIVIL,ZESTRIL) 10 MG tablet Take 1 tablet (10 mg total) by mouth at bedtime. 90 tablet 0  . senna-docusate (SENOKOT-S) 8.6-50 MG tablet Take 1 tablet by mouth at bedtime as needed for moderate constipation. 30 tablet 0  . nicotine (NICODERM CQ) 14 mg/24hr patch Place 1 patch (14 mg total) onto the skin daily. For two weeks then start 7 mg patch 14 patch 0  . nicotine (NICODERM CQ) 7 mg/24hr patch Place 1 patch (7 mg total) onto the skin daily. For two weeks after using the 14 mg patch (Patient not taking: Reported on 01/04/2018) 14 patch 0   No facility-administered medications prior to visit.       Review of Systems  HENT: Positive for congestion, rhinorrhea, sinus pain, sneezing  and sore throat. Negative for ear pain.   Respiratory: Positive for cough and wheezing.   Cardiovascular: Negative for chest pain.  Gastrointestinal: Negative for abdominal pain, diarrhea, nausea and vomiting.  Musculoskeletal: Negative for joint pain and neck pain.  Skin: Negative for rash.  Neurological: Negative for headaches.       Objective:   Physical Exam Vitals:   01/04/18 1337  BP: 139/85  Pulse: 81  Temp: 98.4 F (36.9 C)  TempSrc: Oral  SpO2: 98%  Weight: 178 lb 6.4 oz (80.9 kg)  Height: 5\' 4"  (1.626 m)    Gen: Pleasant, well-nourished, in no distress,  normal affect  ENT: purulence in nares,  mouth clear,  oropharynx clear, ++ postnasal drip,  Cerumen impaction L ear with external otitis inflammatory changes  Neck: No JVD, no TMG, no carotid bruits  Lungs: No use of accessory muscles, no dullness to percussion, clear without rales or rhonchi  Cardiovascular: RRR, heart sounds normal, no murmur or gallops, no peripheral edema  Abdomen: soft and NT, no HSM,  BS normal  Musculoskeletal: No deformities, no cyanosis or clubbing  Neuro: alert, non focal  Skin: Warm, no lesions or rashes  No results found.        Assessment & Plan:  I personally reviewed all images and lab data in the [facility] system as well as any outside material available during this office visit and agree with the  radiology impressions.   Acute non-recurrent maxillary sinusitis Evidence for acute nonrecurrent maxillary sinusitis on exam  Plan  Doxycycline 100 mg twice daily for 5 days  Otitis externa Left external otitis media with cerumen impaction  Plan  Nursing to remove cerumen Polymyxin otic solution 3 drops left ear 4 times daily for 1   17 mL bottle  Shortness of breath Shortness of breath is on the basis of an acute URI syndrome Note recent CT scan of the chest did not show pleural effusion or intrapulmonary infiltrates  Review sinusitis assessment     Sherri Valencia  was seen today for uri.  Diagnoses and all orders for this visit:  Other infective acute otitis externa of left ear  Acute non-recurrent maxillary sinusitis  Shortness of breath  Other orders -     aspirin EC 81 MG tablet; Take 1 tablet (81 mg total) by mouth at bedtime. Take for 3 weeks then stop. -     senna-docusate (SENOKOT-S) 8.6-50 MG tablet; Take 1 tablet by mouth at bedtime as needed for moderate constipation. -  doxycycline (VIBRA-TABS) 100 MG tablet; Take 1 tablet (100 mg total) by mouth 2 (two) times daily for 5 days. -     NEOMYCIN-POLYMYXIN-HYDROCORTISONE (CORTISPORIN) 1 % SOLN OTIC solution; Place 3 drops into the left ear 4 (four) times daily.

## 2018-01-04 NOTE — Assessment & Plan Note (Signed)
Left external otitis media with cerumen impaction  Plan  Nursing to remove cerumen Polymyxin otic solution 3 drops left ear 4 times daily for 1   17 mL bottle

## 2018-01-04 NOTE — Patient Instructions (Signed)
Use polymyxin otic solution 3 drops to left ear 4 times daily until the bottle is empty  Begin doxycycline 1 twice daily for 5 days  Refills on aspirin and Senokot were sent to our pharmacy  We will arrange for our nurse to clean your left ear canal  Keep next scheduled primary care appointment and follow-up with us as needed

## 2018-01-04 NOTE — Congregational Nurse Program (Signed)
BP check. Client states 2 week history of URI. Client states intermittent productive cough- greenish. BBS- good air movement except somewhat coarse in right upper. [facility] Bus passes given. Follow up in clinic tomorrow if any changes.

## 2018-01-05 MED FILL — DOXYCYCLINE HYCLATE 100 MG: 100 | 5 days supply | Qty: 10 | Fill #0

## 2018-01-05 MED FILL — NEO/POLYMYXIN/HC EAR SOLN: 3.5-10000-1 | 14 days supply | Qty: 10 | Fill #0

## 2018-01-06 ENCOUNTER — Emergency Department (HOSPITAL_COMMUNITY)
Admission: EM | Admit: 2018-01-06 | Discharge: 2018-01-06 | Disposition: A | Discharge status: Home / Self Care | Payer: Self-pay | Attending: Emergency Medicine | Admitting: Emergency Medicine

## 2018-01-06 ENCOUNTER — Telehealth: Payer: Self-pay | Admitting: *Deleted

## 2018-01-06 ENCOUNTER — Other Ambulatory Visit: Payer: Self-pay

## 2018-01-06 ENCOUNTER — Encounter (HOSPITAL_COMMUNITY): Payer: Self-pay | Admitting: Emergency Medicine

## 2018-01-06 DIAGNOSIS — F17210 Nicotine dependence, cigarettes, uncomplicated: Secondary | ICD-10-CM | POA: Insufficient documentation

## 2018-01-06 DIAGNOSIS — Z79899 Other long term (current) drug therapy: Secondary | ICD-10-CM | POA: Insufficient documentation

## 2018-01-06 DIAGNOSIS — Z7902 Long term (current) use of antithrombotics/antiplatelets: Secondary | ICD-10-CM | POA: Insufficient documentation

## 2018-01-06 DIAGNOSIS — I10 Essential (primary) hypertension: Secondary | ICD-10-CM | POA: Insufficient documentation

## 2018-01-06 DIAGNOSIS — Z7982 Long term (current) use of aspirin: Secondary | ICD-10-CM | POA: Insufficient documentation

## 2018-01-06 DIAGNOSIS — Z8673 Personal history of transient ischemic attack (TIA), and cerebral infarction without residual deficits: Secondary | ICD-10-CM | POA: Insufficient documentation

## 2018-01-06 DIAGNOSIS — R040 Epistaxis: Secondary | ICD-10-CM | POA: Insufficient documentation

## 2018-01-06 NOTE — Telephone Encounter (Signed)
-----   Message from Cain Saupeammie Fulp, MD sent at 12/23/2017  6:56 PM EST ----- Please notify patient that her chest CT did not show any acute intrathoracic abnormalities. No pleural effusion
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
## 2018-01-06 NOTE — Telephone Encounter (Signed)
Medical Assistant left message on patient's home and cell voicemail. Voicemail states to give a call back to [place] [facility] at [phone]. Please inform patient of CT of chest being normal. Follow up as planned

## 2018-01-06 NOTE — Discharge Instructions (Signed)
Please follow up with PCP as needed. If you experience any chest pain, shortness of breath or worsening symptoms you may return to the ED for reevaluation.
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
## 2018-01-06 NOTE — Congregational Nurse Program (Signed)
BP check. SCAT application reviewed. Follow up in CN clinic as needed.

## 2018-01-12 NOTE — Congregational Nurse Program (Signed)
BP check. Review upcoming appointment time/date.

## 2018-01-18 NOTE — Congregational Nurse Program (Signed)
Patient seen wanting to see if she needed any medications  refilled. All medications up to date.  Fransisco Beauebbie Garwood, CNP, RN [phone]

## 2018-02-09 ENCOUNTER — Other Ambulatory Visit: Payer: Self-pay

## 2018-02-09 ENCOUNTER — Ambulatory Visit (HOSPITAL_COMMUNITY): Payer: Self-pay

## 2020-07-29 IMAGING — MR MR HEAD W/O CM
9 of 10 series · 36 of 48 positions shown · non-contrast
Comparison: CT HEAD November 22, 2017

CLINICAL DATA: Intermittent facial droop, slurred speech and LEFT
extremity numbness for 1 week. History of hypertension, smoker.

EXAM:
MRI HEAD WITHOUT CONTRAST
TECHNIQUE: Multiplanar, multiecho pulse sequences of the brain and surrounding
structures were obtained without intravenous contrast.

[Series 3: DWI · axial · 3.0mm · 1.09mm/px · z∈[-86,+61]mm · 9 of 102 slices shown (1 of 4)]
[im 1/102]
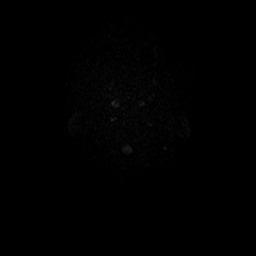
[im 13/102]
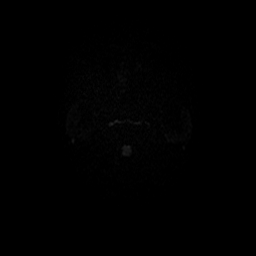
[im 26/102]
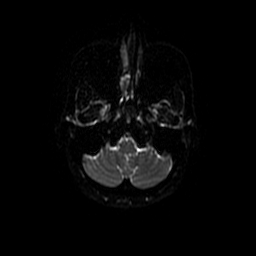
[im 38/102]
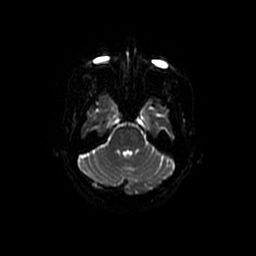
[im 51/102]
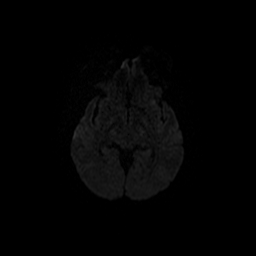
[im 64/102]
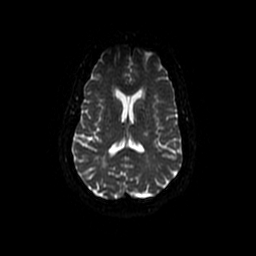
[im 76/102]
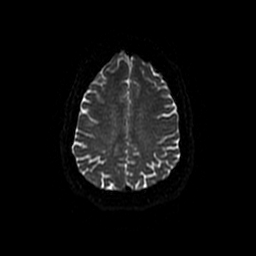
[im 89/102]
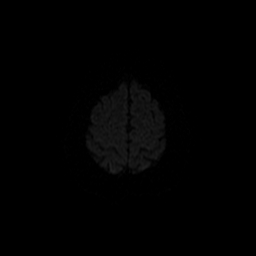
[im 102/102]
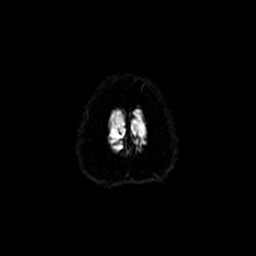

[Series 4: T2 · axial · 5.0mm · 0.47mm/px · z∈[-83,+63]mm · 3 of 26 slices shown (1 of 2)]
[im 1/26]
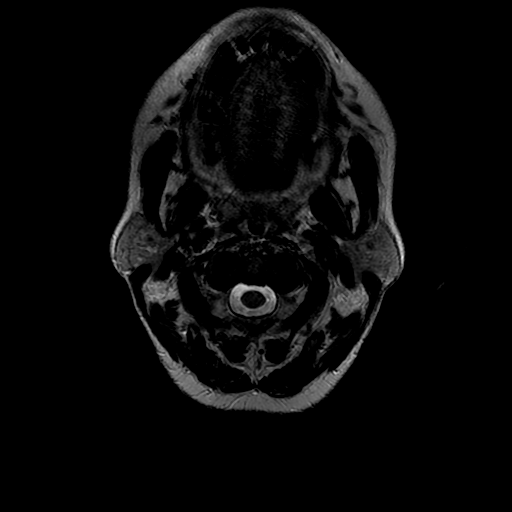
[im 13/26]
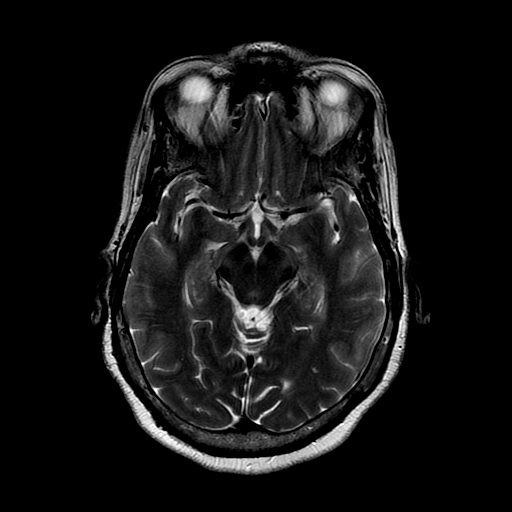
[im 26/26]
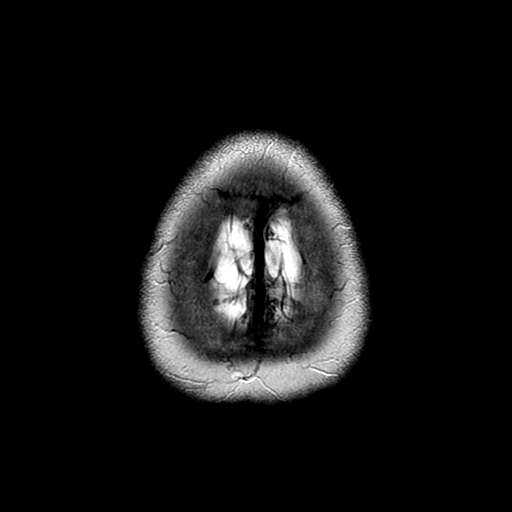

[Series 5: FLAIR · axial · 3.0mm · 0.47mm/px · z∈[-83,+63]mm · 3 of 26 slices shown]
[im 1/26]
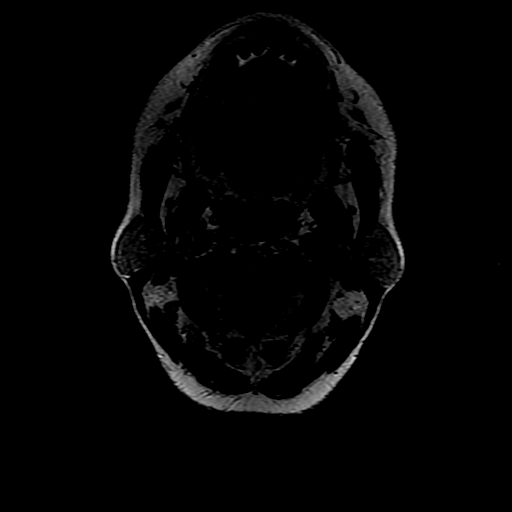
[im 13/26]
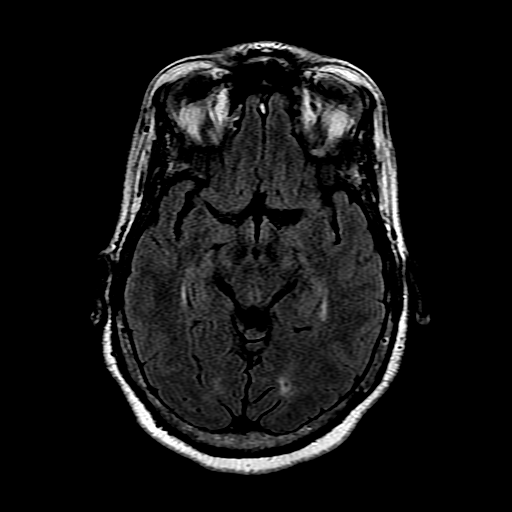
[im 26/26]
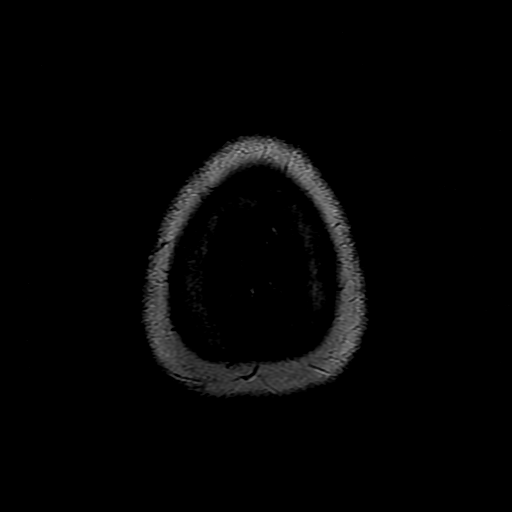

[Series 6: ax mpgr · axial · 5.0mm · 0.47mm/px · 1 of 26 slices shown]
[im 1/26]
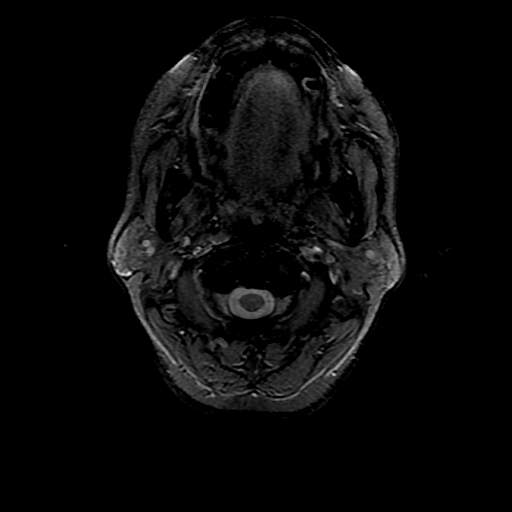

[Series 7: DWI · coronal · 5.0mm · 1.09mm/px · 7 of 68 slices shown (2 of 4)]
[im 1/68]
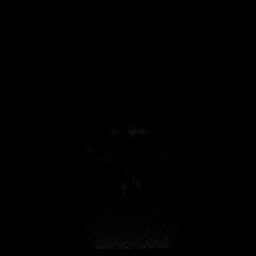
[im 12/68]
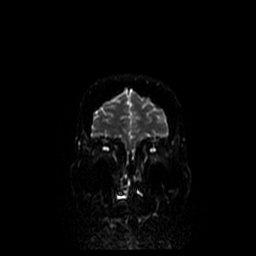
[im 23/68]
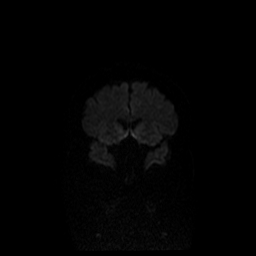
[im 34/68]
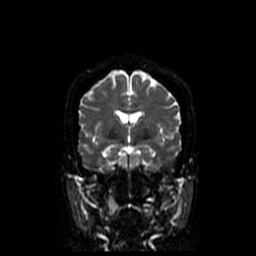
[im 45/68]
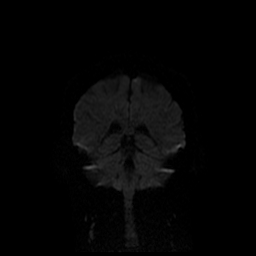
[im 56/68]
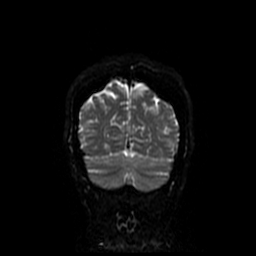
[im 68/68]
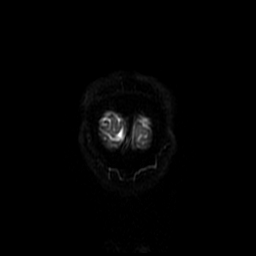

[Series 9: T1 · sagittal · 5.0mm · 0.47mm/px · 2 of 23 slices shown]
[im 1/23]
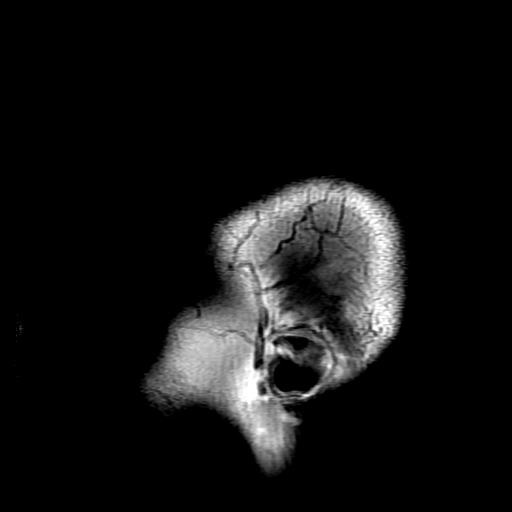
[im 23/23]
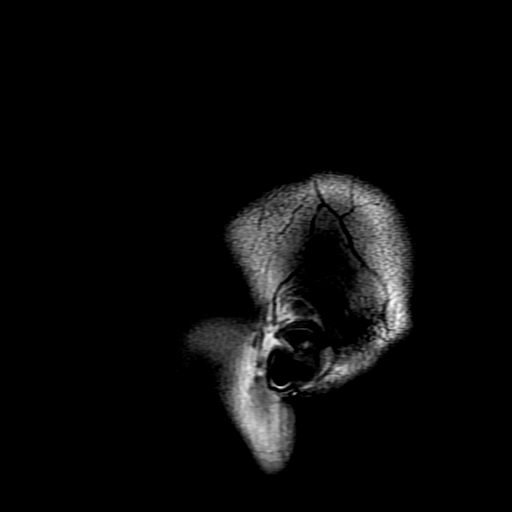

[Series 10: T2 · coronal · 5.0mm · 0.43mm/px · 3 of 29 slices shown (2 of 2)]
[im 1/29]
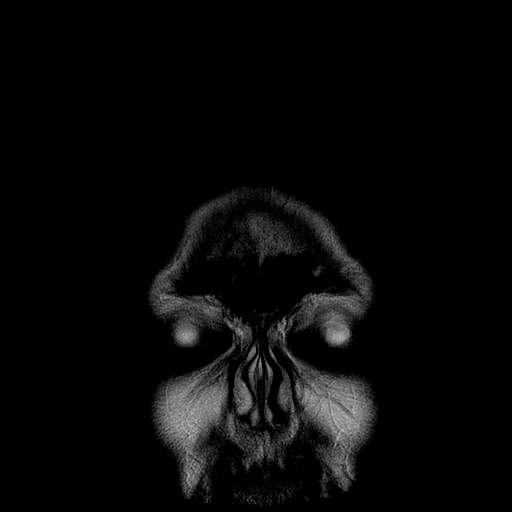
[im 15/29]
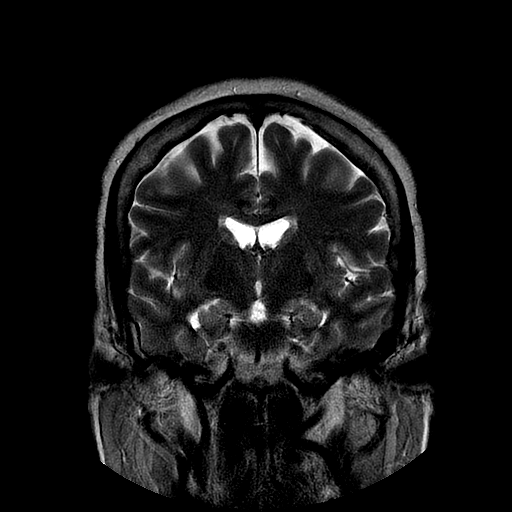
[im 29/29]
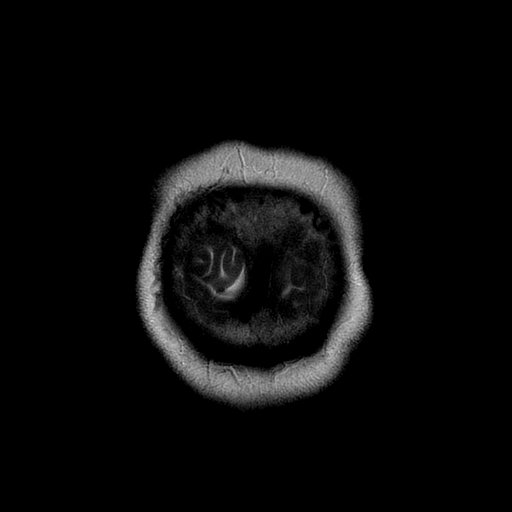

[Series 300: DWI · axial · 3.0mm · 1.09mm/px · z∈[-86,+61]mm · 5 of 51 slices shown (3 of 4)]
[im 1/51]
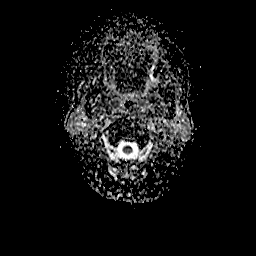
[im 13/51]
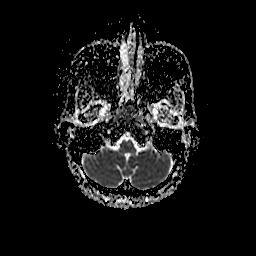
[im 26/51]
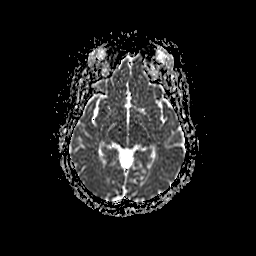
[im 38/51]
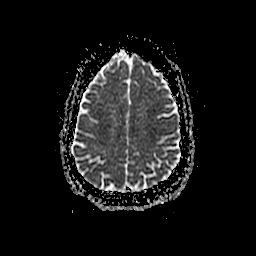
[im 51/51]
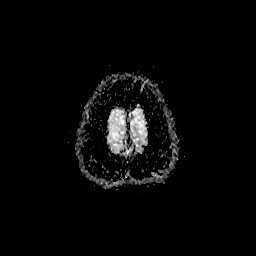

[Series 700: DWI · coronal · 5.0mm · 1.09mm/px · 3 of 34 slices shown (4 of 4)]
[im 1/34]
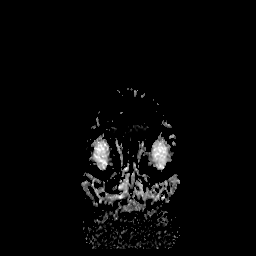
[im 17/34]
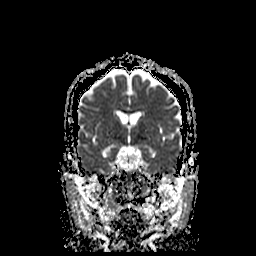
[im 34/34]
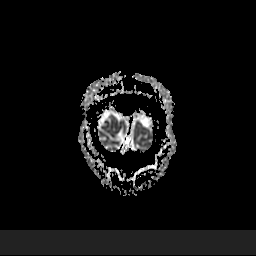

[36 of 48 positions shown; findings below may reference images not displayed]

FINDINGS: INTRACRANIAL CONTENTS: Subcentimeter reduced diffusion posterior
limb LEFT internal capsule, normalized ADC values. No susceptibility
artifact to suggest hemorrhage. The ventricles and sulci are normal
for patient's age. Patchy supratentorial white matter FLAIR T2
hyperintensities. No suspicious parenchymal signal, masses, mass
effect. No abnormal extra-axial fluid collections. No extra-axial
masses.

VASCULAR: Normal major intracranial vascular flow voids present at
skull base.

SKULL AND UPPER CERVICAL SPINE: No abnormal sellar expansion. No
suspicious calvarial bone marrow signal. Craniocervical junction
maintained.

SINUSES/ORBITS: The mastoid air-cells and included paranasal sinuses
are well-aerated.The included ocular globes and orbital contents are
non-suspicious.

OTHER: None.
IMPRESSION: 1. Subacute small LEFT internal capsule infarct.
2. Mild-to-moderate chronic small vessel ischemic changes.

## 2020-07-29 IMAGING — CT CT HEAD W/O CM
3 series · 16 of 47 positions shown, 19 images · non-contrast
Comparison: None.

CLINICAL DATA: Left-sided facial droop both upper and lower
extremity left-sided weakness. Hypertension.

EXAM:
CT HEAD WITHOUT CONTRAST
TECHNIQUE: Contiguous axial images were obtained from the base of the skull
through the vertex without intravenous contrast.

[Series 3: head 5.0 h30s · axial · 0.44mm/px · z∈[+1251,+1391]mm · 10 of 34 slices shown, 13 images]
[im 3/34  brain]
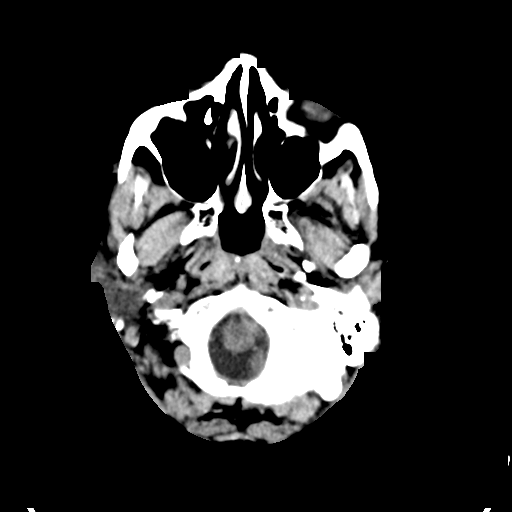
[im 3/34  bone]
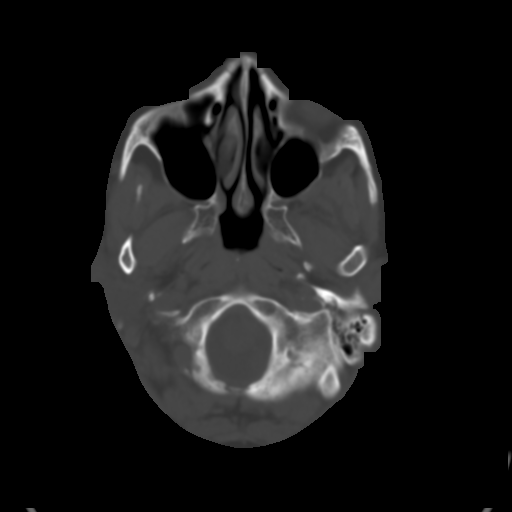
[im 6/34  brain]
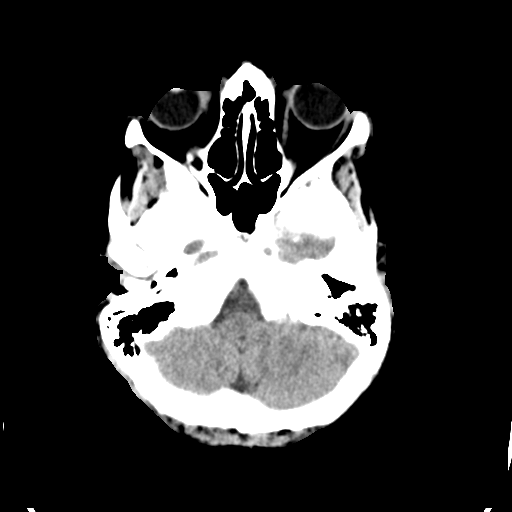
[im 10/34  brain]
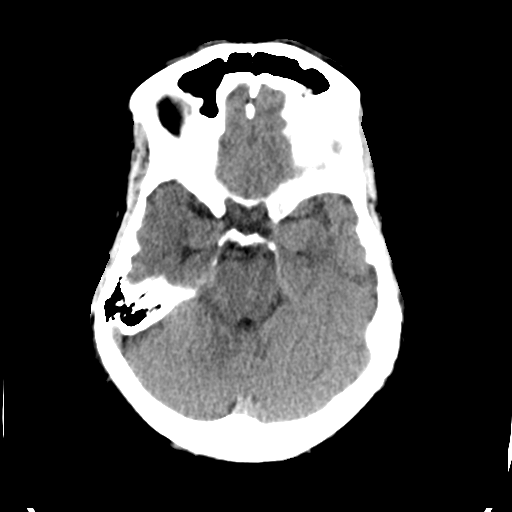
[im 12/34  brain]
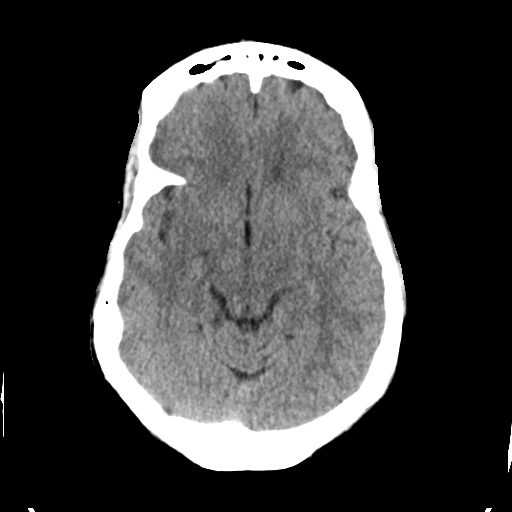
[im 15/34  brain]
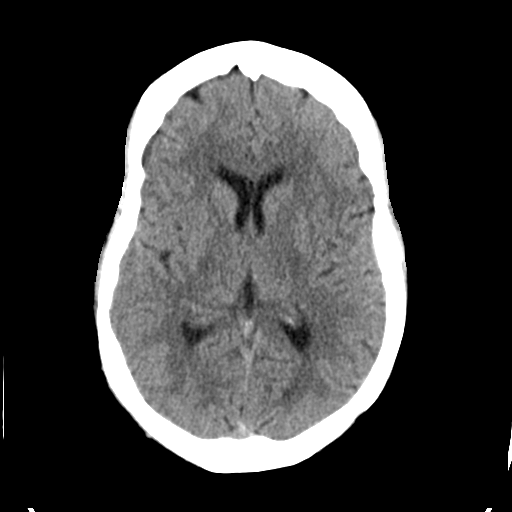
[im 15/34  bone]
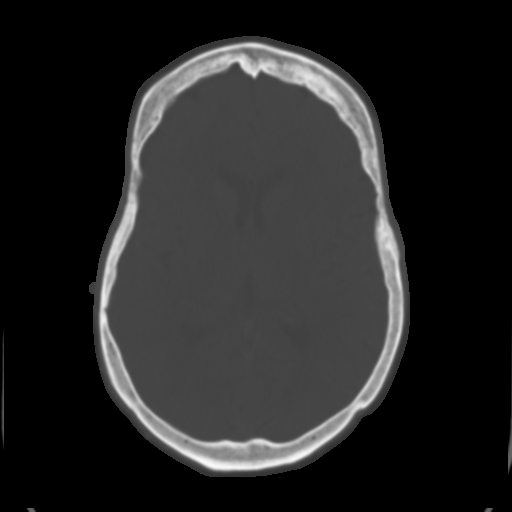
[im 19/34  brain]
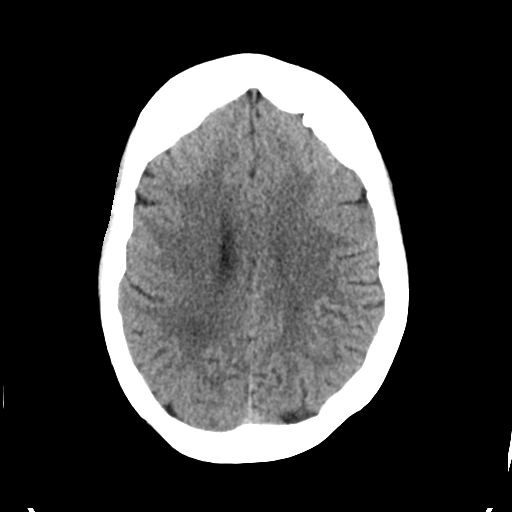
[im 22/34  brain]
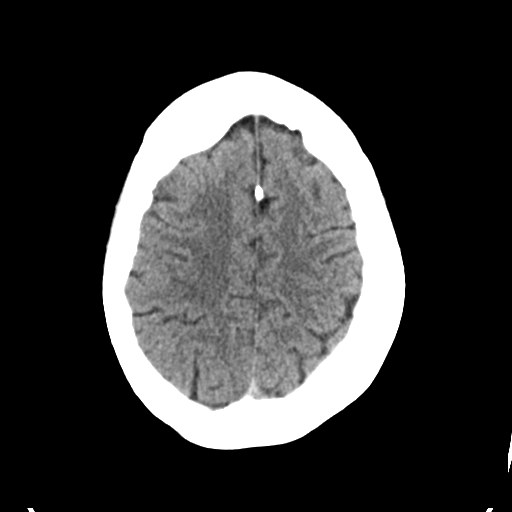
[im 26/34  brain]
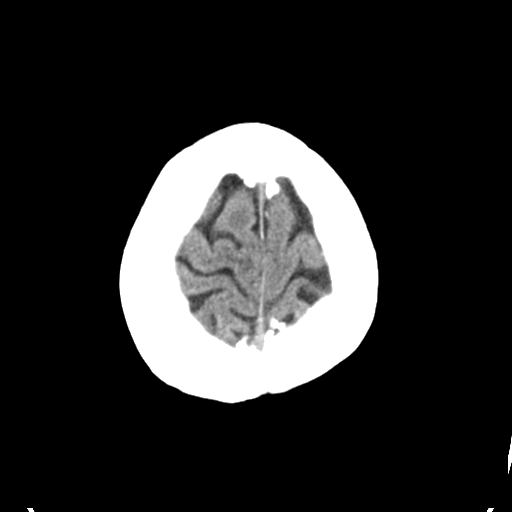
[im 28/34  brain]
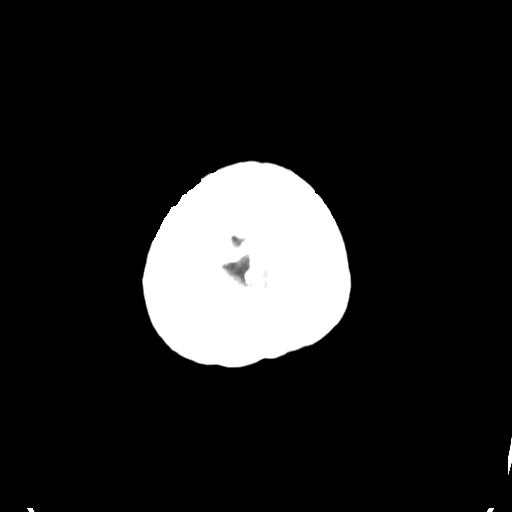
[im 28/34  bone]
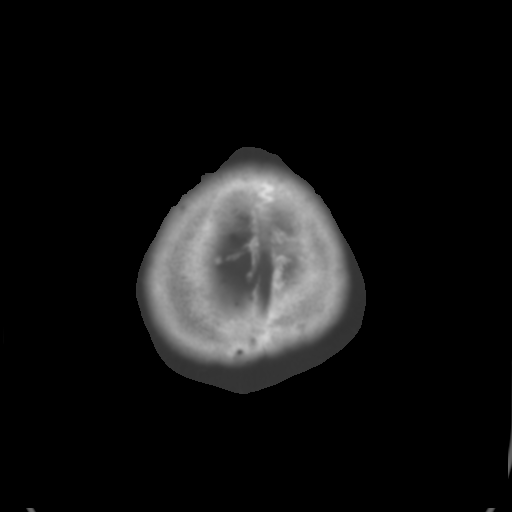
[im 31/34  brain]
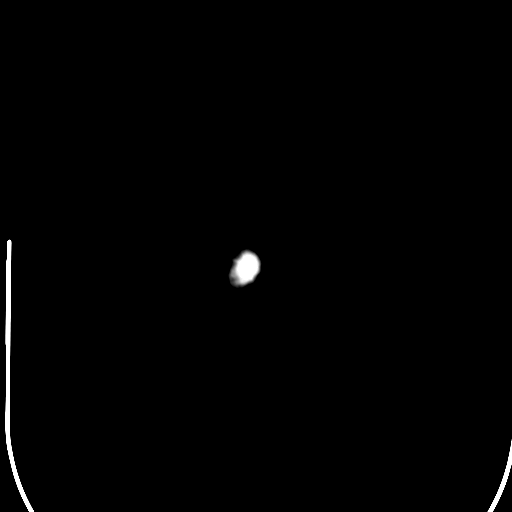

[Series 5: head 3.0 mpr cor · coronal · 0.33mm/px · 3 of 72 slices shown]
[im 24/72  brain]
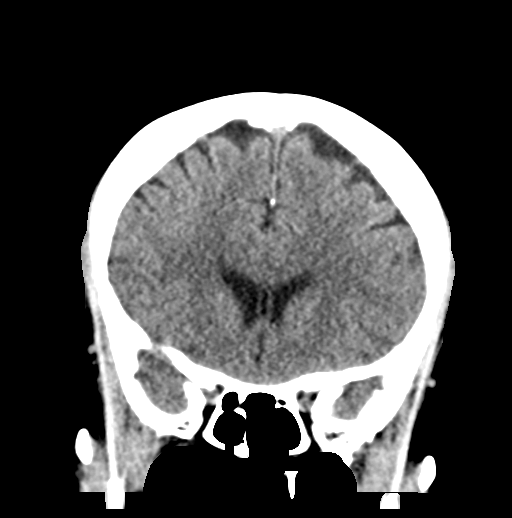
[im 32/72  brain]
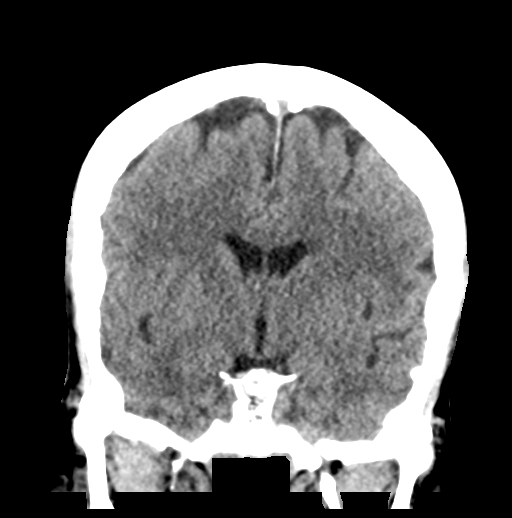
[im 40/72  brain]
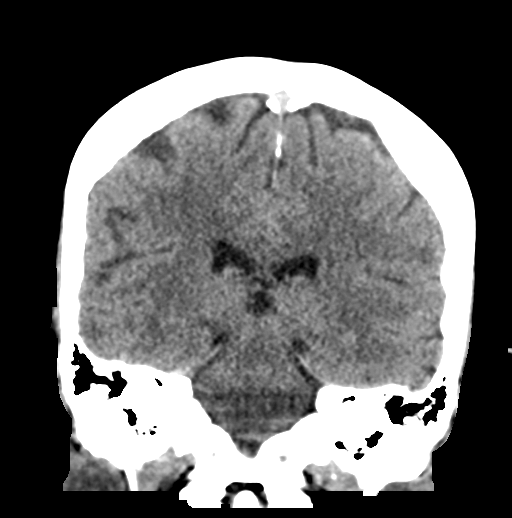

[Series 6: head 3.0 mpr sag · sagittal · 0.33mm/px · 3 of 59 slices shown]
[im 20/59  brain]
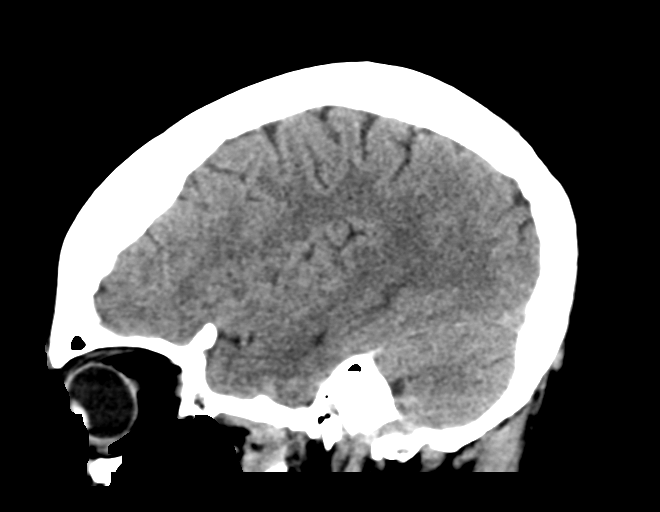
[im 30/59  brain]
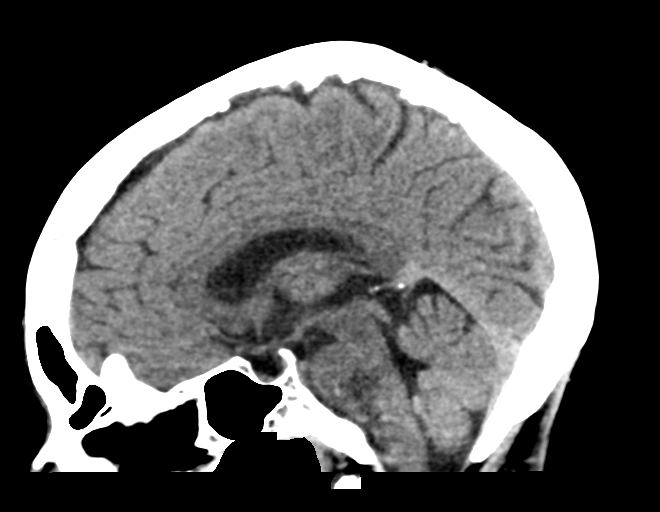
[im 39/59  brain]
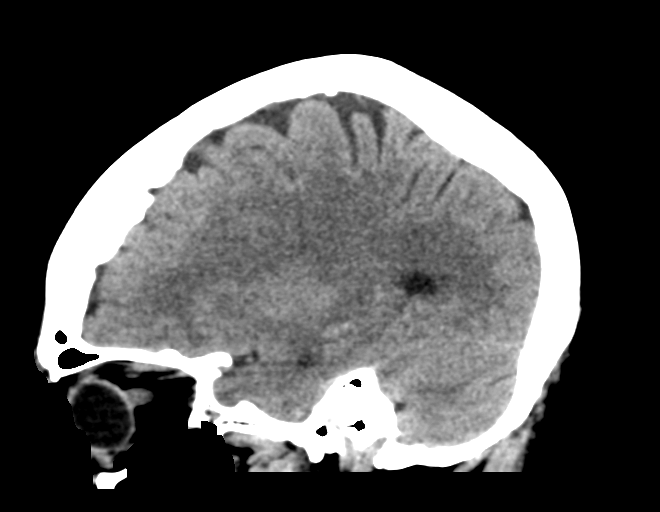

[16 of 47 positions shown; findings below may reference images not displayed]

FINDINGS: Brain: The ventricles are normal in size and configuration. There is
no demonstrable intracranial mass, hemorrhage, extra-axial fluid
collection, or midline shift. There is slight small vessel disease
in the centra semiovale bilaterally. No acute infarct is evident.

Vascular: There is no evident hyperdense vessel. There is mild
calcification in each cavernous carotid artery region.

Skull: The bony calvarium appears intact.

Sinuses/Orbits: Visualized paranasal sinuses are clear. Orbits
appear symmetric bilaterally.

Other: Mastoid air cells are clear.
IMPRESSION: Slight periventricular small vessel disease. No acute infarct
evident. No mass or hemorrhage.

There are foci of vascular calcification in each cavernous carotid
artery.

## 2022-11-13 ENCOUNTER — Encounter: Payer: No Typology Code available for payment source | Admitting: Family

## 2022-11-13 ENCOUNTER — Telehealth: Payer: Self-pay

## 2022-11-13 NOTE — Progress Notes (Signed)
Erroneous encounter-disregard

## 2022-11-13 NOTE — Telephone Encounter (Signed)
CALLED Patient to reschedule missed new patient appointment no answer and couldn't leave voicemail

## 2022-11-14 ENCOUNTER — Ambulatory Visit (INDEPENDENT_AMBULATORY_CARE_PROVIDER_SITE_OTHER): Payer: No Typology Code available for payment source | Admitting: Family

## 2022-11-14 VITALS — BP 157/100 | HR 86 | Temp 98.7°F | Ht 66.0 in | Wt 130.0 lb

## 2022-11-14 DIAGNOSIS — G5601 Carpal tunnel syndrome, right upper limb: Secondary | ICD-10-CM | POA: Diagnosis not present

## 2022-11-14 DIAGNOSIS — Z1159 Encounter for screening for other viral diseases: Secondary | ICD-10-CM

## 2022-11-14 DIAGNOSIS — I10 Essential (primary) hypertension: Secondary | ICD-10-CM | POA: Diagnosis not present

## 2022-11-14 DIAGNOSIS — M5432 Sciatica, left side: Secondary | ICD-10-CM

## 2022-11-14 DIAGNOSIS — Z0120 Encounter for dental examination and cleaning without abnormal findings: Secondary | ICD-10-CM

## 2022-11-14 DIAGNOSIS — Z7689 Persons encountering health services in other specified circumstances: Secondary | ICD-10-CM

## 2022-11-14 MED ORDER — VALSARTAN 40 MG PO TABS: 40.0000 mg | ORAL_TABLET | Freq: Every day | ORAL | 1 refills | Status: DC

## 2022-11-14 MED ORDER — GABAPENTIN 300 MG PO CAPS: 300.0000 mg | ORAL_CAPSULE | Freq: Every day | ORAL | 1 refills | Status: DC

## 2022-11-14 NOTE — Progress Notes (Signed)
Subjective:    Sherri Valencia - 61 y.o. female MRN [id number]  Date of birth: [birth year]  HPI  Sherri Valencia is to establish care.   Current issues and/or concerns: - History of high blood pressure. States she has not taken blood pressure medications in years per her preference. Reports she began taking vinegar to help with blood pressure. Home blood pressures above goal. She is trying to monitor salt intake. She does not exercise outside of normal routine. She does not complain of red flag symptoms such as but not limited to chest pain, shortness of breath, worst headache of life, nausea/vomiting.  - Intermittent left thigh numbness. She denies recent trauma/injury and red flag symptoms.  - Right upper arm numbness. She denies red recent trauma/injury and red flag symptoms.  - Requests to be tested for hepatitis C due to her significant other has the same.  - Requests referral to Dentist to have upper and lower teeth removed. States she only has teeth in the front on the upper and in the front on the lower. - No further issues/concerns for discussion today.    ROS per HPI     Health Maintenance:  Health Maintenance Due  Topic Date Due   Hepatitis C Screening  Never done   DTaP/Tdap/Td (1 - Tdap) Never done   Colonoscopy  Never done   MAMMOGRAM  Never done   Zoster Vaccines- Shingrix (1 of 2) Never done   Cervical Cancer Screening (HPV/Pap Cotest)  09/09/2014   INFLUENZA VACCINE  Never done   COVID-19 Vaccine (1 - 2023-24 season) Never done     Past Medical History: Patient Active Problem List   Diagnosis Date Noted   Acute non-recurrent maxillary sinusitis 01/04/2018   Otitis externa 01/04/2018   Essential hypertension    Shortness of breath    Tobacco abuse    TIA (transient ischemic attack) 11/30/2017      Social History   reports that she has been smoking cigarettes. She has never used smokeless tobacco. She reports that she does not currently use  alcohol. She reports that she does not currently use drugs.   Family History  family history includes Alzheimer's disease in her father; Cancer in her father; Heart disease in her father.   Medications: reviewed and updated   Objective:   Physical Exam BP (!) 157/100   Pulse 86   Temp 98.7 F (37.1 C) (Oral)   Ht 5\' 6"  (1.676 m)   Wt 130 lb (59 kg)   SpO2 96%   BMI 20.98 kg/m   Physical Exam HENT:     Head: Normocephalic and atraumatic.     Nose: Nose normal.     Mouth/Throat:     Mouth: Mucous membranes are moist.     Pharynx: Oropharynx is clear.  Eyes:     Extraocular Movements: Extraocular movements intact.     Conjunctiva/sclera: Conjunctivae normal.     Pupils: Pupils are equal, round, and reactive to light.  Cardiovascular:     Rate and Rhythm: Normal rate and regular rhythm.     Pulses: Normal pulses.     Heart sounds: Normal heart sounds.  Pulmonary:     Effort: Pulmonary effort is normal.     Breath sounds: Normal breath sounds.  Musculoskeletal:        General: Normal range of motion.     Right shoulder: Normal.     Left shoulder: Normal.     Right upper arm: Normal.  Left upper arm: Normal.     Right elbow: Normal.     Left elbow: Normal.     Right forearm: Normal.     Left forearm: Normal.     Right wrist: Normal.     Left wrist: Normal.     Right hand: Normal.     Left hand: Normal.     Cervical back: Normal, normal range of motion and neck supple.     Thoracic back: Normal.     Lumbar back: Normal.     Right hip: Normal.     Left hip: Normal.     Right upper leg: Normal.     Left upper leg: Normal.     Right knee: Normal.     Left knee: Normal.     Right lower leg: Normal.     Left lower leg: Normal.     Right ankle: Normal.     Left ankle: Normal.     Right foot: Normal.     Left foot: Normal.  Neurological:     General: No focal deficit present.     Mental Status: She is alert and oriented to person, place, and time.   Psychiatric:        Mood and Affect: Mood normal.        Behavior: Behavior normal.        Assessment & Plan:  1. Encounter to establish care - Patient presents today to establish care. During the interim follow-up with primary provider as scheduled.  - Return for annual physical examination, labs, and health maintenance. Arrive fasting meaning having no food for at least 8 hours prior to appointment. You may have only water or black coffee. Please take scheduled medications as normal.  2. Primary hypertension - Blood pressure not at goal during today's visit. Patient asymptomatic without chest pressure, chest pain, palpitations, shortness of breath, worst headache of life, and any additional red flag symptoms. - Trial Valsartan as prescribed.  - Routine screening.  - Counseled on blood pressure goal of less than 130/80, low-sodium, DASH diet, medication compliance, and 150 minutes of moderate intensity exercise per week as tolerated. Counseled on medication adherence and adverse effects. - Follow-up with primary provider in 2 weeks or sooner if needed for blood pressure check.  - Basic Metabolic Panel - valsartan (DIOVAN) 40 MG tablet; Take 1 tablet (40 mg total) by mouth daily.  Dispense: 30 tablet; Refill: 1  3. Sciatica of left side - Gabapentin as prescribed. Counseled on medication adherence/adverse effects.  - Follow-up with primary provider in 4 weeks or sooner if needed.  - gabapentin (NEURONTIN) 300 MG capsule; Take 1 capsule (300 mg total) by mouth at bedtime.  Dispense: 30 capsule; Refill: 1  4. Carpal tunnel syndrome of right wrist - Gabapentin as prescribed. Counseled on medication adherence/adverse effects.  - Follow-up with primary provider in 4 weeks or sooner if needed.  - gabapentin (NEURONTIN) 300 MG capsule; Take 1 capsule (300 mg total) by mouth at bedtime.  Dispense: 30 capsule; Refill: 1  5. Encounter for routine dental examination - Referral to Dentistry for  evaluation/management. - Ambulatory referral to Dentistry  6. Need for hepatitis C screening test - Routine screening.  - Hepatitis C Antibody   Patient was given clear instructions to go to Emergency Department or return to medical center if symptoms don't improve, worsen, or new problems develop.The patient verbalized understanding.  I discussed the assessment and treatment plan with the patient. The patient was  provided an opportunity to ask questions and all were answered. The patient agreed with the plan and demonstrated an understanding of the instructions.   The patient was advised to call back or seek an in-person evaluation if the symptoms worsen or if the condition fails to improve as anticipated.    Ricky Stabs, NP 11/14/2022, 9:12 AM Primary Care at [facility]

## 2022-11-14 NOTE — Progress Notes (Signed)
Pt states she just needs a physical.

## 2022-11-15 LAB — BASIC METABOLIC PANEL
BUN/Creatinine Ratio: 8 — ABNORMAL LOW (ref 12–28)
BUN: 6 mg/dL — ABNORMAL LOW (ref 8–27)
CO2: 19 mmol/L — ABNORMAL LOW (ref 20–29)
Calcium: 10.5 mg/dL — ABNORMAL HIGH (ref 8.7–10.3)
Chloride: 102 mmol/L (ref 96–106)
Creatinine, Ser: 0.75 mg/dL (ref 0.57–1.00)
Glucose: 89 mg/dL (ref 70–99)
Potassium: 4.6 mmol/L (ref 3.5–5.2)
Sodium: 141 mmol/L (ref 134–144)
eGFR: 89 mL/min/{1.73_m2} (ref >59)

## 2022-11-15 LAB — HEPATITIS C ANTIBODY: Hep C Virus Ab: NONREACTIVE

## 2022-11-17 ENCOUNTER — Telehealth: Payer: Self-pay

## 2022-11-17 NOTE — Telephone Encounter (Signed)
Pt given lab results per notes of Amy, NP on 11/17/22. Pt verbalized understanding.   Rema Fendt, NP 11/17/2022  7:55 AM EDT     Call patient with update.   - Kidney function normal. - Hepatitis C non reactive. - All other values are normal, stable or within acceptable limits.

## 2022-12-08 ENCOUNTER — Encounter: Payer: No Typology Code available for payment source | Admitting: Family

## 2022-12-08 ENCOUNTER — Ambulatory Visit: Payer: No Typology Code available for payment source | Admitting: Family Medicine

## 2023-02-23 ENCOUNTER — Telehealth: Payer: Self-pay | Admitting: Family

## 2023-02-23 ENCOUNTER — Encounter: Payer: No Typology Code available for payment source | Admitting: Family

## 2023-02-23 NOTE — Progress Notes (Signed)
 Erroneous encounter-disregard

## 2023-02-23 NOTE — Telephone Encounter (Signed)
Called pt to reschedule missed physical appt; could not reach and could not leave a vm

## 2023-05-01 ENCOUNTER — Encounter: Admitting: Family

## 2023-05-01 NOTE — Progress Notes (Signed)
 Erroneous encounter-disregard

## 2023-06-11 ENCOUNTER — Encounter: Admitting: Family

## 2023-06-11 NOTE — Progress Notes (Signed)
 Erroneous encounter-disregard

## 2023-06-12 ENCOUNTER — Telehealth: Payer: Self-pay | Admitting: Family

## 2023-06-12 NOTE — Telephone Encounter (Signed)
 Copied from CRM [id number]. Topic: Appointments - Appointment Scheduling >> Jun 12, 2023 10:07 AM Phil Braun wrote: Rescheduled appt and pt states that she needs a ride to and from, she states she needs a ride and that the office usually arranges that. Please advise,.

## 2023-06-12 NOTE — Telephone Encounter (Signed)
 Attempted to call all numbers on account. Primary number no longer belongs to patient.

## 2023-06-12 NOTE — Telephone Encounter (Signed)
**Note De-identified  [place] Obfuscation** Please advise 

## 2023-07-14 ENCOUNTER — Encounter: Payer: Self-pay | Admitting: Family

## 2023-07-14 ENCOUNTER — Ambulatory Visit (INDEPENDENT_AMBULATORY_CARE_PROVIDER_SITE_OTHER): Admitting: Family

## 2023-07-14 VITALS — BP 135/87 | HR 83 | Temp 98.1°F | Resp 16 | Ht 66.0 in | Wt 172.6 lb

## 2023-07-14 DIAGNOSIS — Z13220 Encounter for screening for lipoid disorders: Secondary | ICD-10-CM

## 2023-07-14 DIAGNOSIS — M5432 Sciatica, left side: Secondary | ICD-10-CM

## 2023-07-14 DIAGNOSIS — Z130 Encounter for screening for diseases of the blood and blood-forming organs and certain disorders involving the immune mechanism: Secondary | ICD-10-CM | POA: Diagnosis not present

## 2023-07-14 DIAGNOSIS — Z13820 Encounter for screening for osteoporosis: Secondary | ICD-10-CM

## 2023-07-14 DIAGNOSIS — Z0000 Encounter for general adult medical examination without abnormal findings: Secondary | ICD-10-CM

## 2023-07-14 DIAGNOSIS — Z1329 Encounter for screening for other suspected endocrine disorder: Secondary | ICD-10-CM

## 2023-07-14 DIAGNOSIS — Z131 Encounter for screening for diabetes mellitus: Secondary | ICD-10-CM

## 2023-07-14 DIAGNOSIS — Z1231 Encounter for screening mammogram for malignant neoplasm of breast: Secondary | ICD-10-CM

## 2023-07-14 DIAGNOSIS — Z1211 Encounter for screening for malignant neoplasm of colon: Secondary | ICD-10-CM

## 2023-07-14 DIAGNOSIS — Z13228 Encounter for screening for other metabolic disorders: Secondary | ICD-10-CM

## 2023-07-14 DIAGNOSIS — Z124 Encounter for screening for malignant neoplasm of cervix: Secondary | ICD-10-CM

## 2023-07-14 DIAGNOSIS — I10 Essential (primary) hypertension: Secondary | ICD-10-CM

## 2023-07-14 MED ORDER — VALSARTAN 40 MG PO TABS: 40.0000 mg | ORAL_TABLET | Freq: Every day | ORAL | 0 refills | Status: DC

## 2023-07-14 MED ORDER — GABAPENTIN 300 MG PO CAPS: 300.0000 mg | ORAL_CAPSULE | Freq: Every day | ORAL | 0 refills | Status: DC

## 2023-07-14 NOTE — Progress Notes (Signed)
 Welcome To Medicare  Sherri Valencia is a 63 y.o. female who presents for a Welcome to Medicare exam.  Her issues/concerns for discussion today: - Doing well on Valsartan , no issues/concerns. She does not complain of red flag symptoms such as but not limited to chest pain, shortness of breath, worst headache of life, nausea/vomiting.  - Doing well on Gabapentin , no issues/concerns. - No further issues/concerns for discussion today.    Patient Active Problem List   Diagnosis Date Noted   Acute non-recurrent maxillary sinusitis 01/04/2018   Otitis externa 01/04/2018   Essential hypertension    Shortness of breath    Tobacco abuse    TIA (transient ischemic attack) 11/30/2017    Health Maintenance Due  Topic Date Due   Colonoscopy  Never done   MAMMOGRAM  Never done   DEXA SCAN  Never done    Health Habits Exercise: yes Diet: she watches what she eats   Depression Screen Over the past two weeks have you:     Felt down or depressed? no     Had little interest or pleasure in doing things? no     Additional depression screening completed? no.      07/14/2023    2:43 PM 11/14/2022    9:39 AM 01/04/2018    2:05 PM 12/11/2017    2:34 PM  Depression screen PHQ 2/9  Decreased Interest 0 0 1 0  Down, Depressed, Hopeless 0 0 0 1  PHQ - 2 Score 0 0 1 1  Altered sleeping 0  0 3  Tired, decreased energy 0  3 3  Change in appetite 0  0 0  Feeling bad or failure about yourself  0  0 0  Trouble concentrating 0  0 0  Moving slowly or fidgety/restless 0  0 3  Suicidal thoughts 0  0 0  PHQ-9 Score 0  4 10  Difficult doing work/chores Not difficult at all             Functional Ability Does the patient need help with: Ardell Koller index)         Bathing: no         Dressing : no         Toileting: no         Transferring: no         Continence: No         Feeding: no           Safety Screen Does the home have:         Rugs in the hallway: no         Grab bars in the bathroom:  yes         Handrails on the stairs:no stairs          Stairs in home: no stairs         Poor lightning: no  Hearing Evaluation Do you have trouble hearing the television when others do not? no Do you have to strain to hear/understand conversations? no   Falls Risk: Does the patient need assistance with ambulation? no Does the patient have a history of a fall in the last 90 days? no Is the patient at risk for falls? no Was the patient's timed "Get Up and Go Test" unsteady or longer than 30 seconds? no   Advanced Care Planning Patient has executed an Advance Directive: no If no, patient was given the opportunity to execute an Advance Directive today? yes  This patient has the ability to prepare an Advance Directive: yes   Cognitive Assessment Does the patient have evidence of cognitive impairment? No The patient does not have evidence of a change in mood/affect, appearance, speech, memory or motor skills.      07/14/2023    2:44 PM  6CIT Screen  What Year? 0 points  What month? 0 points  What time? 0 points  Count back from 20 0 points  Months in reverse 0 points  Repeat phrase 0 points  Total Score 0 points   PHYSICAL EXAM: Vitals:   07/14/23 1448 07/14/23 1455  BP: (!) 164/98 135/87  Pulse: 83   Resp: 16   Temp: 98.1 F (36.7 C)   TempSrc: Oral   SpO2: 96%   Weight: 172 lb 9.6 oz (78.3 kg)   Height: 5\' 6"  (1.676 m)    Body mass index is 27.86 kg/m.   Vision screen performed. See vision activity in chart for documentation Other exam performed: Yes  Physical Exam HENT:     Head: Normocephalic and atraumatic.     Right Ear: Tympanic membrane, ear canal and external ear normal.     Left Ear: Tympanic membrane, ear canal and external ear normal.     Nose: Nose normal.     Mouth/Throat:     Mouth: Mucous membranes are moist.     Pharynx: Oropharynx is clear.  Eyes:     Extraocular Movements: Extraocular movements intact.     Conjunctiva/sclera: Conjunctivae  normal.     Pupils: Pupils are equal, round, and reactive to light.  Neck:     Thyroid: No thyroid mass, thyromegaly or thyroid tenderness.  Cardiovascular:     Rate and Rhythm: Normal rate and regular rhythm.     Pulses: Normal pulses.     Heart sounds: Normal heart sounds.  Pulmonary:     Effort: Pulmonary effort is normal.     Breath sounds: Normal breath sounds.  Chest:     Comments: Patient declined. Abdominal:     General: Bowel sounds are normal.     Palpations: Abdomen is soft.  Genitourinary:    Comments: Patient declined. Musculoskeletal:        General: Normal range of motion.     Right shoulder: Normal.     Left shoulder: Normal.     Right upper arm: Normal.     Left upper arm: Normal.     Right elbow: Normal.     Left elbow: Normal.     Right forearm: Normal.     Left forearm: Normal.     Right wrist: Normal.     Left wrist: Normal.     Right hand: Normal.     Left hand: Normal.     Cervical back: Normal, normal range of motion and neck supple.     Thoracic back: Normal.     Lumbar back: Normal.     Right hip: Normal.     Left hip: Normal.     Right upper leg: Normal.     Left upper leg: Normal.     Right knee: Normal.     Left knee: Normal.     Right lower leg: Normal.     Left lower leg: Normal.     Right ankle: Normal.     Left ankle: Normal.     Right foot: Normal.     Left foot: Normal.  Skin:    General: Skin is warm and dry.  Capillary Refill: Capillary refill takes less than 2 seconds.  Neurological:     General: No focal deficit present.     Mental Status: She is alert and oriented to person, place, and time.  Psychiatric:        Mood and Affect: Mood normal.        Behavior: Behavior normal.      Assessment: Welcome To Medicare Exam   Assessment & Plan: 1. Encounter for Medicare annual wellness exam (Primary) 2. Annual physical exam - Counseled on 150 minutes of exercise per week as tolerated, healthy eating (including  decreased daily intake of saturated fats, cholesterol, added sugars, sodium), STI prevention, and routine healthcare maintenance.  3. Screening for metabolic disorder - Routine screening.  - CMP14+EGFR  4. Screening for deficiency anemia - Routine screening.  - CBC  5. Diabetes mellitus screening - Routine screening.  - Hemoglobin A1c  6. Screening cholesterol level - Routine screening.  - Lipid panel  7. Thyroid disorder screen - Routine screening.  - TSH  8. Encounter for screening mammogram for malignant neoplasm of breast - Routine screening.  - MM Digital Screening; Future  9. Cervical cancer screening - Referral to Gynecology for evaluation/management. - Ambulatory referral to Gynecology  10. Osteoporosis screening - Routine screening.  - DG Bone Density; Future  11. Colon cancer screening - Referral to Gastroenterology for colon cancer screening by colonoscopy. - Ambulatory referral to Gastroenterology  12. Primary hypertension - Continue Valsartan  as prescribed.  - Counseled on blood pressure goal of less than 130/80, low-sodium, DASH diet, medication compliance, and 150 minutes of moderate intensity exercise per week as tolerated. Counseled on medication adherence and adverse effects. - Follow-up with primary provider in 3 months or sooner if needed.  - valsartan  (DIOVAN ) 40 MG tablet; Take 1 tablet (40 mg total) by mouth daily.  Dispense: 90 tablet; Refill: 0  13. Sciatica of left side - Gabapentin  as prescribed. Counseled on medication adherence/adverse effects.   - Follow-up with primary provider as scheduled. - gabapentin  (NEURONTIN ) 300 MG capsule; Take 1 capsule (300 mg total) by mouth at bedtime.  Dispense: 90 capsule; Refill: 0  During the course of the visit the patient was educated and counseled about appropriate screening and preventive services including:   The following orders were placed at today's visit; Orders Placed This Encounter   Procedures   MM Digital Screening   DG Bone Density   CBC   Lipid panel   CMP14+EGFR   Hemoglobin A1c   TSH   Ambulatory referral to Gastroenterology   Ambulatory referral to Gynecology   Patient was given clear instructions to take patient to Emergency Department or return to medical center if symptoms don't improve, worsen, or new problems develop and verbalized understanding.  An after visit summary with all of these plans was given to the patient.

## 2023-07-14 NOTE — Progress Notes (Signed)
 Subjective:    Sherri Valencia is a 63 y.o. female who presents for a Welcome to Medicare exam.          Objective:    Today's Vitals   07/14/23 1448 07/14/23 1455  BP: (!) 164/98 135/87  Pulse: 83   Resp: 16   Temp: 98.1 F (36.7 C)   TempSrc: Oral   SpO2: 96%   Weight: 172 lb 9.6 oz (78.3 kg)   Height: 5\' 6"  (1.676 m)   Body mass index is 27.86 kg/m.  Medications Outpatient Encounter Medications as of 07/14/2023  Medication Sig   amLODipine  (NORVASC ) 5 MG tablet Take 1 tablet (5 mg total) by mouth at bedtime.   atorvastatin  (LIPITOR) 40 MG tablet Take 1 tablet (40 mg total) by mouth daily at 6 PM.   clopidogrel  (PLAVIX ) 75 MG tablet Take 1 tablet (75 mg total) by mouth daily.   gabapentin  (NEURONTIN ) 300 MG capsule Take 1 capsule (300 mg total) by mouth at bedtime.   hydrochlorothiazide  (MICROZIDE ) 12.5 MG capsule Take 1 capsule (12.5 mg total) by mouth daily.   lisinopril  (PRINIVIL ,ZESTRIL ) 10 MG tablet Take 1 tablet (10 mg total) by mouth at bedtime.   losartan (COZAAR) 100 MG tablet Take 100 mg by mouth daily.   NEOMYCIN -POLYMYXIN-HYDROCORTISONE (CORTISPORIN) 1 % SOLN OTIC solution Place 3 drops into the left ear 4 (four) times daily.   senna-docusate (SENOKOT-S) 8.6-50 MG tablet Take 1 tablet by mouth at bedtime as needed for moderate constipation.   valsartan  (DIOVAN ) 40 MG tablet Take 1 tablet (40 mg total) by mouth daily.   No facility-administered encounter medications on file as of 07/14/2023.     History: Past Medical History:  Diagnosis Date   Hypertension    History reviewed. No pertinent surgical history.  Family History  Problem Relation Age of Onset   Alzheimer's disease Father    Cancer Father    Heart disease Father    Social History   Occupational History   Not on file  Tobacco Use   Smoking status: Every Day    Types: Cigarettes   Smokeless tobacco: Never   Tobacco comments:    smokes 2 cigs./ day  Substance and Sexual Activity    Alcohol use: Not Currently   Drug use: Not Currently   Sexual activity: Not on file    Tobacco Counseling Ready to quit: Not Answered Counseling given: Not Answered Tobacco comments: smokes 2 cigs./ day   Immunizations and Health Maintenance  There is no immunization history on file for this patient. Health Maintenance Due  Topic Date Due   Colonoscopy  Never done   MAMMOGRAM  Never done   DEXA SCAN  Never done    Activities of Daily Living    07/14/2023    2:46 PM  In your present state of health, do you have any difficulty performing the following activities:  Hearing? 0  Vision? 0  Difficulty concentrating or making decisions? 0  Walking or climbing stairs? 0  Dressing or bathing? 0  Doing errands, shopping? 0    Physical Exam   Physical Exam (optional), or other factors deemed appropriate based on the beneficiary's medical and social history and current clinical standards.   Advanced Directives: Does Patient Have a Medical Advance Directive?: No Would patient like information on creating a medical advance directive?: No - Patient declined  EKG:        Assessment:    This is a routine wellness examination for this  patient .   Vision/Hearing screen Vision Screening   Right eye Left eye Both eyes  Without correction     With correction 20/20 20/20 20/20      Goals   None    Depression Screen    07/14/2023    2:43 PM 11/14/2022    9:39 AM 01/04/2018    2:05 PM 12/11/2017    2:34 PM  PHQ 2/9 Scores  PHQ - 2 Score 0 0 1 1  PHQ- 9 Score 0  4 10     Fall Risk    07/14/2023    2:43 PM  Fall Risk   Falls in the past year? 0  Number falls in past yr: 0  Injury with Fall? 0  Risk for fall due to : No Fall Risks    Cognitive Function:    07/14/2023    2:45 PM  MMSE - Mini Mental State Exam  Orientation to time 5  Orientation to Place 5  Registration 3  Attention/ Calculation 5  Recall 3  Language- name 2 objects 2  Language- repeat 1   Language- follow 3 step command 2  Language- read & follow direction 1  Write a sentence 1  Copy design 1  Total score 29        07/14/2023    2:44 PM  6CIT Screen  What Year? 0 points  What month? 0 points  What time? 0 points  Count back from 20 0 points  Months in reverse 0 points  Repeat phrase 0 points  Total Score 0 points    Patient Care Team: Senaida Dama, NP as PCP - General (Nurse Practitioner)     Plan:     I have personally reviewed and noted the following in the patient's chart:   Medical and social history Use of alcohol, tobacco or illicit drugs  Current medications and supplements including opioid prescriptions. Patient is not currently taking opioid prescriptions. Functional ability and status Nutritional status Physical activity Advanced directives List of other physicians Hospitalizations, surgeries, and ER visits in previous 12 months Vitals Screenings to include cognitive, depression, and falls Referrals and appointments  In addition, I have reviewed and discussed with patient certain preventive protocols, quality metrics, and best practice recommendations. A written personalized care plan for preventive services as well as general preventive health recommendations were provided to patient.     Senaida Dama, NP 07/14/2023

## 2023-07-15 ENCOUNTER — Ambulatory Visit: Payer: Self-pay | Admitting: Family

## 2023-07-15 DIAGNOSIS — Z13220 Encounter for screening for lipoid disorders: Secondary | ICD-10-CM

## 2023-07-15 DIAGNOSIS — Z13228 Encounter for screening for other metabolic disorders: Secondary | ICD-10-CM

## 2023-07-15 LAB — CMP14+EGFR
ALT: 29 IU/L (ref 0–32)
AST: 66 IU/L — ABNORMAL HIGH (ref 0–40)
Albumin: 4.4 g/dL (ref 3.9–4.9)
Alkaline Phosphatase: 214 IU/L — ABNORMAL HIGH (ref 44–121)
BUN/Creatinine Ratio: 12 (ref 12–28)
BUN: 9 mg/dL (ref 8–27)
Bilirubin Total: 0.6 mg/dL (ref 0.0–1.2)
CO2: 19 mmol/L — ABNORMAL LOW (ref 20–29)
Calcium: 10.3 mg/dL (ref 8.7–10.3)
Chloride: 104 mmol/L (ref 96–106)
Creatinine, Ser: 0.73 mg/dL (ref 0.57–1.00)
Globulin, Total: 2.9 g/dL (ref 1.5–4.5)
Glucose: 96 mg/dL (ref 70–99)
Potassium: 4.9 mmol/L (ref 3.5–5.2)
Sodium: 141 mmol/L (ref 134–144)
Total Protein: 7.3 g/dL (ref 6.0–8.5)
eGFR: 91 mL/min/{1.73_m2} (ref >59)

## 2023-07-15 LAB — LIPID PANEL
Chol/HDL Ratio: 3 ratio (ref 0.0–4.4)
Cholesterol, Total: 196 mg/dL (ref 100–199)
HDL: 66 mg/dL (ref >39)
LDL Chol Calc (NIH): 109 mg/dL — ABNORMAL HIGH (ref 0–99)
Triglycerides: 121 mg/dL (ref 0–149)
VLDL Cholesterol Cal: 21 mg/dL (ref 5–40)

## 2023-07-15 LAB — CBC
Hematocrit: 45.7 % (ref 34.0–46.6)
Hemoglobin: 15 g/dL (ref 11.1–15.9)
MCH: 33.7 pg — ABNORMAL HIGH (ref 26.6–33.0)
MCHC: 32.8 g/dL (ref 31.5–35.7)
MCV: 103 fL — ABNORMAL HIGH (ref 79–97)
Platelets: 210 10*3/uL (ref 150–450)
RBC: 4.45 x10E6/uL (ref 3.77–5.28)
RDW: 12.2 % (ref 11.7–15.4)
WBC: 7.7 10*3/uL (ref 3.4–10.8)

## 2023-07-15 LAB — TSH: TSH: 2.67 u[IU]/mL (ref 0.450–4.500)

## 2023-07-15 LAB — HEMOGLOBIN A1C
Est. average glucose Bld gHb Est-mCnc: 94 mg/dL
Hgb A1c MFr Bld: 4.9 % (ref 4.8–5.6)

## 2023-07-29 ENCOUNTER — Ambulatory Visit: Payer: Self-pay

## 2023-07-29 NOTE — Telephone Encounter (Signed)
 Patient called in wanting her lab results. Note from Greig Drones, NP provided, no further questions at this time. This RN is unable to schedule lab only appointments, as she needs to follow-up in 4-6 weeks.    Patients number is [phone]                      Copied from CRM [id number]. Topic: Clinical - Lab/Test Results >> Jul 29, 2023  4:12 PM DeAngela L wrote: Reason for CRM: patient calling about her lab results pt received a letter telling her to call the office   Pt num [phone] (M) Reason for Disposition . [1] Caller requesting NON-URGENT health information AND [2] PCP's office is the best resource  Protocols used: Information Only Call - No Triage-A-AH

## 2023-07-30 NOTE — Telephone Encounter (Signed)
 Patient scheduled.

## 2023-09-03 ENCOUNTER — Other Ambulatory Visit: Payer: Self-pay | Admitting: *Deleted

## 2023-09-03 ENCOUNTER — Other Ambulatory Visit

## 2023-09-03 DIAGNOSIS — Z13228 Encounter for screening for other metabolic disorders: Secondary | ICD-10-CM

## 2023-09-03 DIAGNOSIS — Z13220 Encounter for screening for lipoid disorders: Secondary | ICD-10-CM

## 2023-09-04 ENCOUNTER — Ambulatory Visit: Payer: Self-pay | Admitting: Family

## 2023-09-04 DIAGNOSIS — E785 Hyperlipidemia, unspecified: Secondary | ICD-10-CM

## 2023-09-04 LAB — LIPID PANEL
Chol/HDL Ratio: 3.4 ratio (ref 0.0–4.4)
Cholesterol, Total: 206 mg/dL — ABNORMAL HIGH (ref 100–199)
HDL: 61 mg/dL (ref >39)
LDL Chol Calc (NIH): 128 mg/dL — ABNORMAL HIGH (ref 0–99)
Triglycerides: 94 mg/dL (ref 0–149)
VLDL Cholesterol Cal: 17 mg/dL (ref 5–40)

## 2023-09-04 LAB — SPECIMEN STATUS REPORT

## 2023-09-04 MED ORDER — ATORVASTATIN CALCIUM 20 MG PO TABS: 20.0000 mg | ORAL_TABLET | Freq: Every day | ORAL | 0 refills | Status: DC

## 2023-09-05 LAB — SPECIMEN STATUS REPORT

## 2023-09-05 LAB — AST: AST: 31 IU/L (ref 0–40)

## 2023-09-08 ENCOUNTER — Ambulatory Visit
Admission: RE | Admit: 2023-09-08 | Discharge: 2023-09-08 | Disposition: A | Discharge status: Home / Self Care | Source: Ambulatory Visit | Attending: Family | Admitting: Family

## 2023-09-08 DIAGNOSIS — Z1231 Encounter for screening mammogram for malignant neoplasm of breast: Secondary | ICD-10-CM

## 2023-09-11 ENCOUNTER — Other Ambulatory Visit: Payer: Self-pay | Admitting: Family

## 2023-09-11 ENCOUNTER — Telehealth: Payer: Self-pay | Admitting: Family

## 2023-09-11 DIAGNOSIS — R928 Other abnormal and inconclusive findings on diagnostic imaging of breast: Secondary | ICD-10-CM

## 2023-09-11 NOTE — Telephone Encounter (Signed)
 A document form has been faxed:  STAT , to be filled out by provider. Send document back via Fax within ASAP. Document is located in providers tray at front office.           Fax number:  [phone]

## 2023-09-15 NOTE — Telephone Encounter (Signed)
 I faxed paperback on 09/14/2023

## 2023-09-22 ENCOUNTER — Ambulatory Visit
Admission: RE | Admit: 2023-09-22 | Discharge: 2023-09-22 | Disposition: A | Discharge status: Home / Self Care | Source: Ambulatory Visit | Attending: Family | Admitting: Family

## 2023-09-22 ENCOUNTER — Ambulatory Visit: Payer: Self-pay | Admitting: Family

## 2023-09-22 DIAGNOSIS — R928 Other abnormal and inconclusive findings on diagnostic imaging of breast: Secondary | ICD-10-CM

## 2023-10-14 ENCOUNTER — Other Ambulatory Visit: Payer: Self-pay | Admitting: Family

## 2023-10-14 DIAGNOSIS — M5432 Sciatica, left side: Secondary | ICD-10-CM

## 2023-10-14 DIAGNOSIS — I10 Essential (primary) hypertension: Secondary | ICD-10-CM

## 2023-10-22 ENCOUNTER — Telehealth: Payer: Self-pay | Admitting: Family Medicine

## 2023-10-22 NOTE — Telephone Encounter (Signed)
 Called patient in regards to a referral we received. Left VM and office number for patient to call and get scheduled.

## 2023-12-07 ENCOUNTER — Other Ambulatory Visit: Payer: Self-pay | Admitting: Family

## 2023-12-07 DIAGNOSIS — E785 Hyperlipidemia, unspecified: Secondary | ICD-10-CM

## 2023-12-07 NOTE — Telephone Encounter (Signed)
 Complete

## 2024-01-13 ENCOUNTER — Other Ambulatory Visit: Payer: Self-pay | Admitting: Family

## 2024-01-13 DIAGNOSIS — M5432 Sciatica, left side: Secondary | ICD-10-CM

## 2024-01-13 DIAGNOSIS — I10 Essential (primary) hypertension: Secondary | ICD-10-CM

## 2024-01-14 NOTE — Telephone Encounter (Signed)
 Requested Prescriptions  Pending Prescriptions Disp Refills   valsartan  (DIOVAN ) 40 MG tablet [Pharmacy Med Name: VALSARTAN  40 MG TABLET] 90 tablet 0    Sig: TAKE 1 TABLET BY MOUTH EVERY DAY     Cardiovascular:  Angiotensin Receptor Blockers Failed - 01/14/2024  3:28 PM      Failed - Cr in normal range and within 180 days    Creatinine, Ser  Date Value Ref Range Status  07/14/2023 0.73 0.57 - 1.00 mg/dL Final         Failed - K in normal range and within 180 days    Potassium  Date Value Ref Range Status  07/14/2023 4.9 3.5 - 5.2 mmol/L Final         Failed - Valid encounter within last 6 months    Recent Outpatient Visits           6 months ago Encounter for Medicare annual wellness exam   [facility], [place], NP   1 year ago Encounter to establish care   [facility], Amy J, NP   6 years ago Other infective acute otitis externa of left ear   [facility] [place]. [facility] Brien Belvie BRAVO, MD   6 years ago Essential hypertension   [facility] [place]. [facility] [address], [place], MD              Passed - Patient is not pregnant      Passed - Last BP in normal range    BP Readings from Last 1 Encounters:  07/14/23 135/87          gabapentin  (NEURONTIN ) 300 MG capsule [Pharmacy Med Name: GABAPENTIN  300 MG CAPSULE] 90 capsule 0    Sig: TAKE 1 CAPSULE BY MOUTH EVERYDAY AT BEDTIME     Neurology: Anticonvulsants - gabapentin  Passed - 01/14/2024  3:28 PM      Passed - Cr in normal range and within 360 days    Creatinine, Ser  Date Value Ref Range Status  07/14/2023 0.73 0.57 - 1.00 mg/dL Final         Passed - Completed PHQ-2 or PHQ-9 in the last 360 days      Passed - Valid encounter within last 12 months    Recent Outpatient Visits           6 months ago Encounter for Medicare annual wellness exam    [place] [facility], [place], NP   1 year ago Encounter to establish care   [facility] [place], Amy J, NP   6 years ago Other infective acute otitis externa of left ear   [facility] [place]. [facility] Brien Belvie BRAVO, MD   6 years ago Essential hypertension   [facility] [place]. [facility] Alec House, MD

## 2024-02-24 ENCOUNTER — Other Ambulatory Visit: Payer: Self-pay | Admitting: *Deleted

## 2024-02-24 DIAGNOSIS — E785 Hyperlipidemia, unspecified: Secondary | ICD-10-CM

## 2024-02-24 DIAGNOSIS — M5432 Sciatica, left side: Secondary | ICD-10-CM

## 2024-02-24 DIAGNOSIS — I10 Essential (primary) hypertension: Secondary | ICD-10-CM

## 2024-02-25 NOTE — Telephone Encounter (Signed)
 Schedule appointment. Last office visit 07/14/2023. During the interim report to the Emergency Department/Urgent Care/call 911 for immediate medical evaluation.

## 2024-02-26 ENCOUNTER — Telehealth: Payer: Self-pay | Admitting: Family

## 2024-02-26 NOTE — Telephone Encounter (Signed)
 Routed front staff to schedule f/u appt.

## 2024-02-26 NOTE — Telephone Encounter (Signed)
 I called patient to make a appt for her medication refill and to set up transportation for appt.  Left voicemail

## 2024-02-26 NOTE — Telephone Encounter (Signed)
 Noted.
